# Patient Record
Sex: Female | Born: 1955 | Race: White | Hispanic: No | Marital: Married | Smoking: Former smoker
Health system: Southern US, Community
[De-identification: ages and names within clinical notes are randomized; demographics above are authoritative.]

## PROBLEM LIST (undated history)

## (undated) ENCOUNTER — Emergency Department: Discharge status: Absent without leave | Payer: Medicare Other

## (undated) DIAGNOSIS — I779 Disorder of arteries and arterioles, unspecified: Secondary | ICD-10-CM

## (undated) DIAGNOSIS — E785 Hyperlipidemia, unspecified: Secondary | ICD-10-CM

## (undated) DIAGNOSIS — R55 Syncope and collapse: Secondary | ICD-10-CM

## (undated) DIAGNOSIS — I10 Essential (primary) hypertension: Secondary | ICD-10-CM

## (undated) DIAGNOSIS — I5189 Other ill-defined heart diseases: Secondary | ICD-10-CM

## (undated) DIAGNOSIS — M069 Rheumatoid arthritis, unspecified: Secondary | ICD-10-CM

## (undated) DIAGNOSIS — I471 Supraventricular tachycardia, unspecified: Secondary | ICD-10-CM

## (undated) DIAGNOSIS — E119 Type 2 diabetes mellitus without complications: Secondary | ICD-10-CM

## (undated) DIAGNOSIS — C801 Malignant (primary) neoplasm, unspecified: Secondary | ICD-10-CM

## (undated) HISTORY — DX: Disorder of arteries and arterioles, unspecified: I77.9

## (undated) HISTORY — PX: BREAST EXCISIONAL BIOPSY: SUR124

## (undated) HISTORY — DX: Rheumatoid arthritis, unspecified: M06.9

## (undated) HISTORY — PX: CHOLECYSTECTOMY: SHX55

## (undated) HISTORY — DX: Other ill-defined heart diseases: I51.89

## (undated) HISTORY — DX: Syncope and collapse: R55

## (undated) HISTORY — DX: Supraventricular tachycardia, unspecified: I47.10

---

## 1998-10-16 ENCOUNTER — Emergency Department (HOSPITAL_COMMUNITY): Admission: EM | Admit: 1998-10-16 | Discharge: 1998-10-16 | Payer: Self-pay | Admitting: Emergency Medicine

## 2000-05-24 DIAGNOSIS — M069 Rheumatoid arthritis, unspecified: Secondary | ICD-10-CM | POA: Insufficient documentation

## 2002-03-29 ENCOUNTER — Ambulatory Visit (HOSPITAL_COMMUNITY): Admission: RE | Admit: 2002-03-29 | Discharge: 2002-03-29 | Payer: Self-pay | Admitting: Obstetrics and Gynecology

## 2002-04-04 ENCOUNTER — Encounter (INDEPENDENT_AMBULATORY_CARE_PROVIDER_SITE_OTHER): Payer: Self-pay | Admitting: Specialist

## 2002-04-04 ENCOUNTER — Encounter: Admission: RE | Admit: 2002-04-04 | Discharge: 2002-04-04 | Payer: Self-pay | Admitting: Family Medicine

## 2002-04-04 ENCOUNTER — Encounter: Payer: Self-pay | Admitting: Internal Medicine

## 2002-05-15 ENCOUNTER — Ambulatory Visit (HOSPITAL_BASED_OUTPATIENT_CLINIC_OR_DEPARTMENT_OTHER): Admission: RE | Admit: 2002-05-15 | Discharge: 2002-05-15 | Payer: Self-pay | Admitting: General Surgery

## 2002-05-15 ENCOUNTER — Encounter (INDEPENDENT_AMBULATORY_CARE_PROVIDER_SITE_OTHER): Payer: Self-pay | Admitting: *Deleted

## 2004-01-25 ENCOUNTER — Emergency Department: Payer: Self-pay | Admitting: Emergency Medicine

## 2004-01-27 ENCOUNTER — Emergency Department: Payer: Self-pay | Admitting: Emergency Medicine

## 2004-02-04 ENCOUNTER — Emergency Department: Payer: Self-pay | Admitting: Emergency Medicine

## 2004-11-19 ENCOUNTER — Ambulatory Visit: Payer: Self-pay | Admitting: Family Medicine

## 2004-12-01 ENCOUNTER — Ambulatory Visit: Payer: Self-pay | Admitting: *Deleted

## 2004-12-10 ENCOUNTER — Ambulatory Visit: Payer: Self-pay | Admitting: Family Medicine

## 2004-12-31 ENCOUNTER — Ambulatory Visit: Payer: Self-pay | Admitting: Family Medicine

## 2005-01-07 ENCOUNTER — Encounter: Admission: RE | Admit: 2005-01-07 | Discharge: 2005-01-07 | Payer: Self-pay | Admitting: Family Medicine

## 2005-04-29 ENCOUNTER — Ambulatory Visit: Payer: Self-pay | Admitting: Family Medicine

## 2005-05-04 ENCOUNTER — Ambulatory Visit: Payer: Self-pay | Admitting: Family Medicine

## 2005-07-29 ENCOUNTER — Ambulatory Visit: Payer: Self-pay | Admitting: Family Medicine

## 2006-03-02 ENCOUNTER — Ambulatory Visit: Payer: Self-pay | Admitting: Family Medicine

## 2006-04-07 ENCOUNTER — Ambulatory Visit (HOSPITAL_COMMUNITY): Admission: RE | Admit: 2006-04-07 | Discharge: 2006-04-07 | Payer: Self-pay | Admitting: Internal Medicine

## 2006-05-11 ENCOUNTER — Ambulatory Visit: Payer: Self-pay | Admitting: Family Medicine

## 2006-05-11 ENCOUNTER — Encounter (INDEPENDENT_AMBULATORY_CARE_PROVIDER_SITE_OTHER): Payer: Self-pay | Admitting: Family Medicine

## 2006-05-12 DIAGNOSIS — I10 Essential (primary) hypertension: Secondary | ICD-10-CM | POA: Insufficient documentation

## 2006-05-12 DIAGNOSIS — E782 Mixed hyperlipidemia: Secondary | ICD-10-CM | POA: Insufficient documentation

## 2006-05-12 DIAGNOSIS — F410 Panic disorder [episodic paroxysmal anxiety] without agoraphobia: Secondary | ICD-10-CM | POA: Insufficient documentation

## 2006-06-02 ENCOUNTER — Ambulatory Visit: Payer: Self-pay | Admitting: Family Medicine

## 2006-11-10 ENCOUNTER — Encounter (INDEPENDENT_AMBULATORY_CARE_PROVIDER_SITE_OTHER): Payer: Self-pay | Admitting: *Deleted

## 2007-03-07 ENCOUNTER — Ambulatory Visit: Payer: Self-pay | Admitting: Internal Medicine

## 2007-03-07 LAB — CONVERTED CEMR LAB
ALT: 13 units/L (ref 0–35)
AST: 12 units/L (ref 0–37)
BUN: 18 mg/dL (ref 6–23)
Basophils Absolute: 0.1 10*3/uL (ref 0.0–0.1)
Basophils Relative: 1 % (ref 0–1)
CO2: 21 meq/L (ref 19–32)
Cholesterol: 173 mg/dL (ref 0–200)
Creatinine, Ser: 0.87 mg/dL (ref 0.40–1.20)
Eosinophils Relative: 1 % (ref 0–5)
HCT: 40.9 % (ref 36.0–46.0)
HDL: 40 mg/dL (ref >39)
Hemoglobin: 13.5 g/dL (ref 12.0–15.0)
MCHC: 33 g/dL (ref 30.0–36.0)
Monocytes Absolute: 0.5 10*3/uL (ref 0.1–1.0)
RDW: 14.1 % (ref 11.5–15.5)
Total Bilirubin: 0.3 mg/dL (ref 0.3–1.2)
Total CHOL/HDL Ratio: 4.3
VLDL: 42 mg/dL — ABNORMAL HIGH (ref 0–40)

## 2007-11-29 ENCOUNTER — Ambulatory Visit: Payer: Self-pay | Admitting: Family Medicine

## 2007-11-29 LAB — CONVERTED CEMR LAB
ALT: 18 units/L (ref 0–35)
BUN: 17 mg/dL (ref 6–23)
CO2: 24 meq/L (ref 19–32)
Cholesterol: 165 mg/dL (ref 0–200)
Creatinine, Ser: 0.77 mg/dL (ref 0.40–1.20)
Glucose, Bld: 115 mg/dL — ABNORMAL HIGH (ref 70–99)
HDL: 40 mg/dL (ref >39)
Sed Rate: 19 mm/hr (ref 0–22)
Total Bilirubin: 0.5 mg/dL (ref 0.3–1.2)
Total CHOL/HDL Ratio: 4.1
Triglycerides: 195 mg/dL — ABNORMAL HIGH (ref <150)
VLDL: 39 mg/dL (ref 0–40)

## 2007-12-06 ENCOUNTER — Ambulatory Visit: Payer: Self-pay | Admitting: Internal Medicine

## 2007-12-09 ENCOUNTER — Ambulatory Visit (HOSPITAL_COMMUNITY): Admission: RE | Admit: 2007-12-09 | Discharge: 2007-12-09 | Payer: Self-pay | Admitting: Family Medicine

## 2008-02-02 ENCOUNTER — Encounter (INDEPENDENT_AMBULATORY_CARE_PROVIDER_SITE_OTHER): Payer: Self-pay | Admitting: Adult Health

## 2008-02-02 ENCOUNTER — Ambulatory Visit: Payer: Self-pay | Admitting: Internal Medicine

## 2008-02-02 LAB — CONVERTED CEMR LAB
ALT: 41 units/L — ABNORMAL HIGH (ref 0–35)
Alkaline Phosphatase: 81 units/L (ref 39–117)
Basophils Absolute: 0.1 10*3/uL (ref 0.0–0.1)
Cholesterol: 186 mg/dL (ref 0–200)
Creatinine, Ser: 0.71 mg/dL (ref 0.40–1.20)
Eosinophils Absolute: 0.1 10*3/uL (ref 0.0–0.7)
Eosinophils Relative: 1 % (ref 0–5)
HCT: 43.2 % (ref 36.0–46.0)
Hemoglobin: 14.2 g/dL (ref 12.0–15.0)
LDL Cholesterol: 115 mg/dL — ABNORMAL HIGH (ref 0–99)
MCHC: 32.9 g/dL (ref 30.0–36.0)
MCV: 92.9 fL (ref 78.0–100.0)
Monocytes Absolute: 0.4 10*3/uL (ref 0.1–1.0)
Platelets: 440 10*3/uL — ABNORMAL HIGH (ref 150–400)
RDW: 15.2 % (ref 11.5–15.5)
Sodium: 136 meq/L (ref 135–145)
Total Bilirubin: 0.5 mg/dL (ref 0.3–1.2)
Total CHOL/HDL Ratio: 4.3
Total Protein: 8 g/dL (ref 6.0–8.3)
Triglycerides: 140 mg/dL (ref <150)
VLDL: 28 mg/dL (ref 0–40)

## 2008-02-03 ENCOUNTER — Ambulatory Visit (HOSPITAL_COMMUNITY): Admission: RE | Admit: 2008-02-03 | Discharge: 2008-02-03 | Payer: Self-pay | Admitting: Internal Medicine

## 2008-02-07 ENCOUNTER — Ambulatory Visit (HOSPITAL_COMMUNITY): Admission: RE | Admit: 2008-02-07 | Discharge: 2008-02-07 | Payer: Self-pay | Admitting: Internal Medicine

## 2008-02-23 ENCOUNTER — Ambulatory Visit: Payer: Self-pay | Admitting: Internal Medicine

## 2008-03-13 ENCOUNTER — Ambulatory Visit: Payer: Self-pay | Admitting: Internal Medicine

## 2008-04-04 ENCOUNTER — Encounter (INDEPENDENT_AMBULATORY_CARE_PROVIDER_SITE_OTHER): Payer: Self-pay | Admitting: Internal Medicine

## 2008-04-04 ENCOUNTER — Ambulatory Visit: Payer: Self-pay | Admitting: Internal Medicine

## 2008-04-04 ENCOUNTER — Encounter (INDEPENDENT_AMBULATORY_CARE_PROVIDER_SITE_OTHER): Payer: Self-pay | Admitting: Adult Health

## 2008-04-04 LAB — CONVERTED CEMR LAB
ALT: 40 units/L — ABNORMAL HIGH (ref 0–35)
AST: 28 units/L (ref 0–37)
Albumin: 4.6 g/dL (ref 3.5–5.2)
Alkaline Phosphatase: 69 units/L (ref 39–117)
Basophils Absolute: 0.1 10*3/uL (ref 0.0–0.1)
Basophils Relative: 1 % (ref 0–1)
Eosinophils Absolute: 0.2 10*3/uL (ref 0.0–0.7)
Eosinophils Relative: 3 % (ref 0–5)
HCT: 41.3 % (ref 36.0–46.0)
LDL Cholesterol: 91 mg/dL (ref 0–99)
Lymphs Abs: 2.1 10*3/uL (ref 0.7–4.0)
MCV: 96 fL (ref 78.0–100.0)
Microalb, Ur: 0.95 mg/dL (ref 0.00–1.89)
Neutrophils Relative %: 62 % (ref 43–77)
Platelets: 374 10*3/uL (ref 150–400)
Potassium: 4.1 meq/L (ref 3.5–5.3)
RDW: 14.3 % (ref 11.5–15.5)
Sodium: 137 meq/L (ref 135–145)
Total Bilirubin: 0.5 mg/dL (ref 0.3–1.2)
Total Protein: 7.5 g/dL (ref 6.0–8.3)
Triglycerides: 231 mg/dL — ABNORMAL HIGH (ref <150)
VLDL: 46 mg/dL — ABNORMAL HIGH (ref 0–40)
WBC: 7.6 10*3/uL (ref 4.0–10.5)

## 2008-04-26 ENCOUNTER — Ambulatory Visit: Payer: Self-pay | Admitting: Internal Medicine

## 2009-03-08 ENCOUNTER — Ambulatory Visit: Payer: Self-pay | Admitting: General Surgery

## 2009-09-25 ENCOUNTER — Ambulatory Visit: Payer: Self-pay

## 2009-10-24 ENCOUNTER — Ambulatory Visit: Payer: Self-pay

## 2009-11-23 ENCOUNTER — Ambulatory Visit: Payer: Self-pay

## 2009-12-24 ENCOUNTER — Ambulatory Visit: Payer: Self-pay

## 2010-02-10 IMAGING — CR DG CHEST 2V
2 series · 2 of 2 positions shown · non-contrast
Comparison: None

CLINICAL DATA: History given of tobacco smoking .

CHEST - 2 VIEW

[w chest pa]
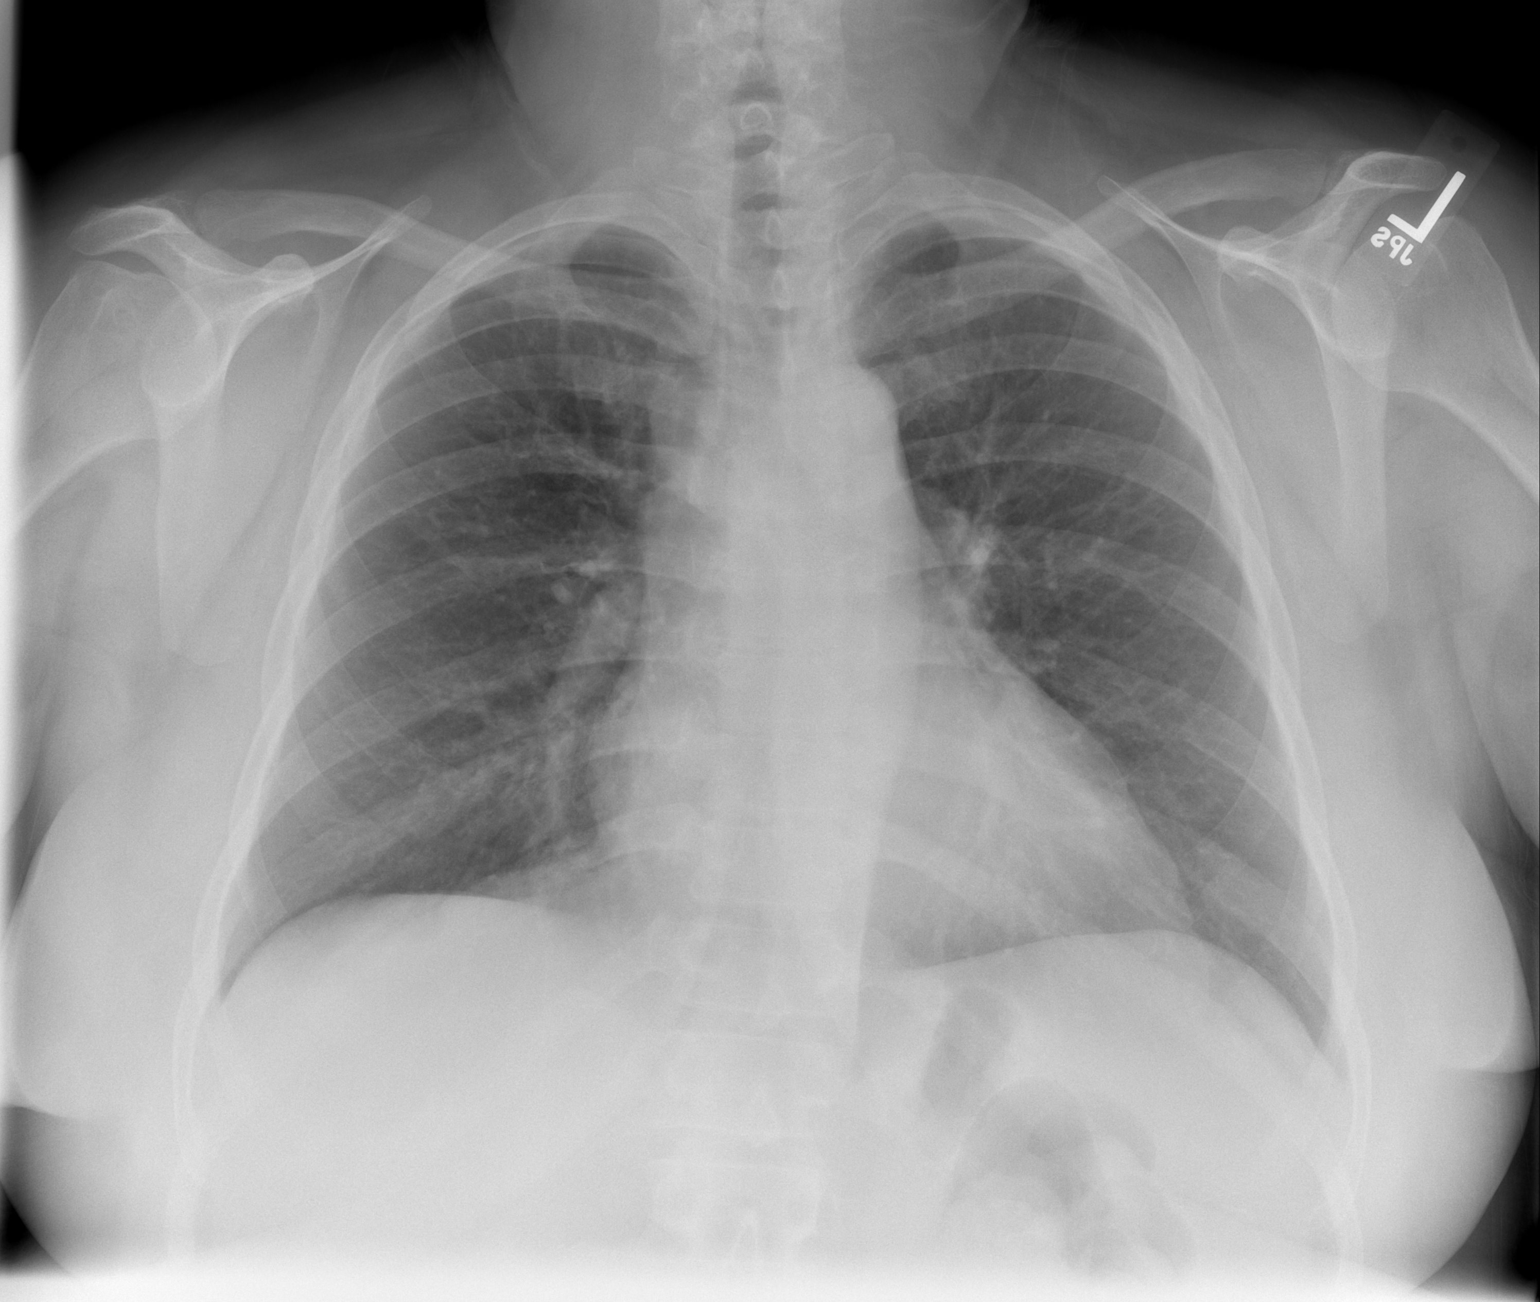

[w chest lat]
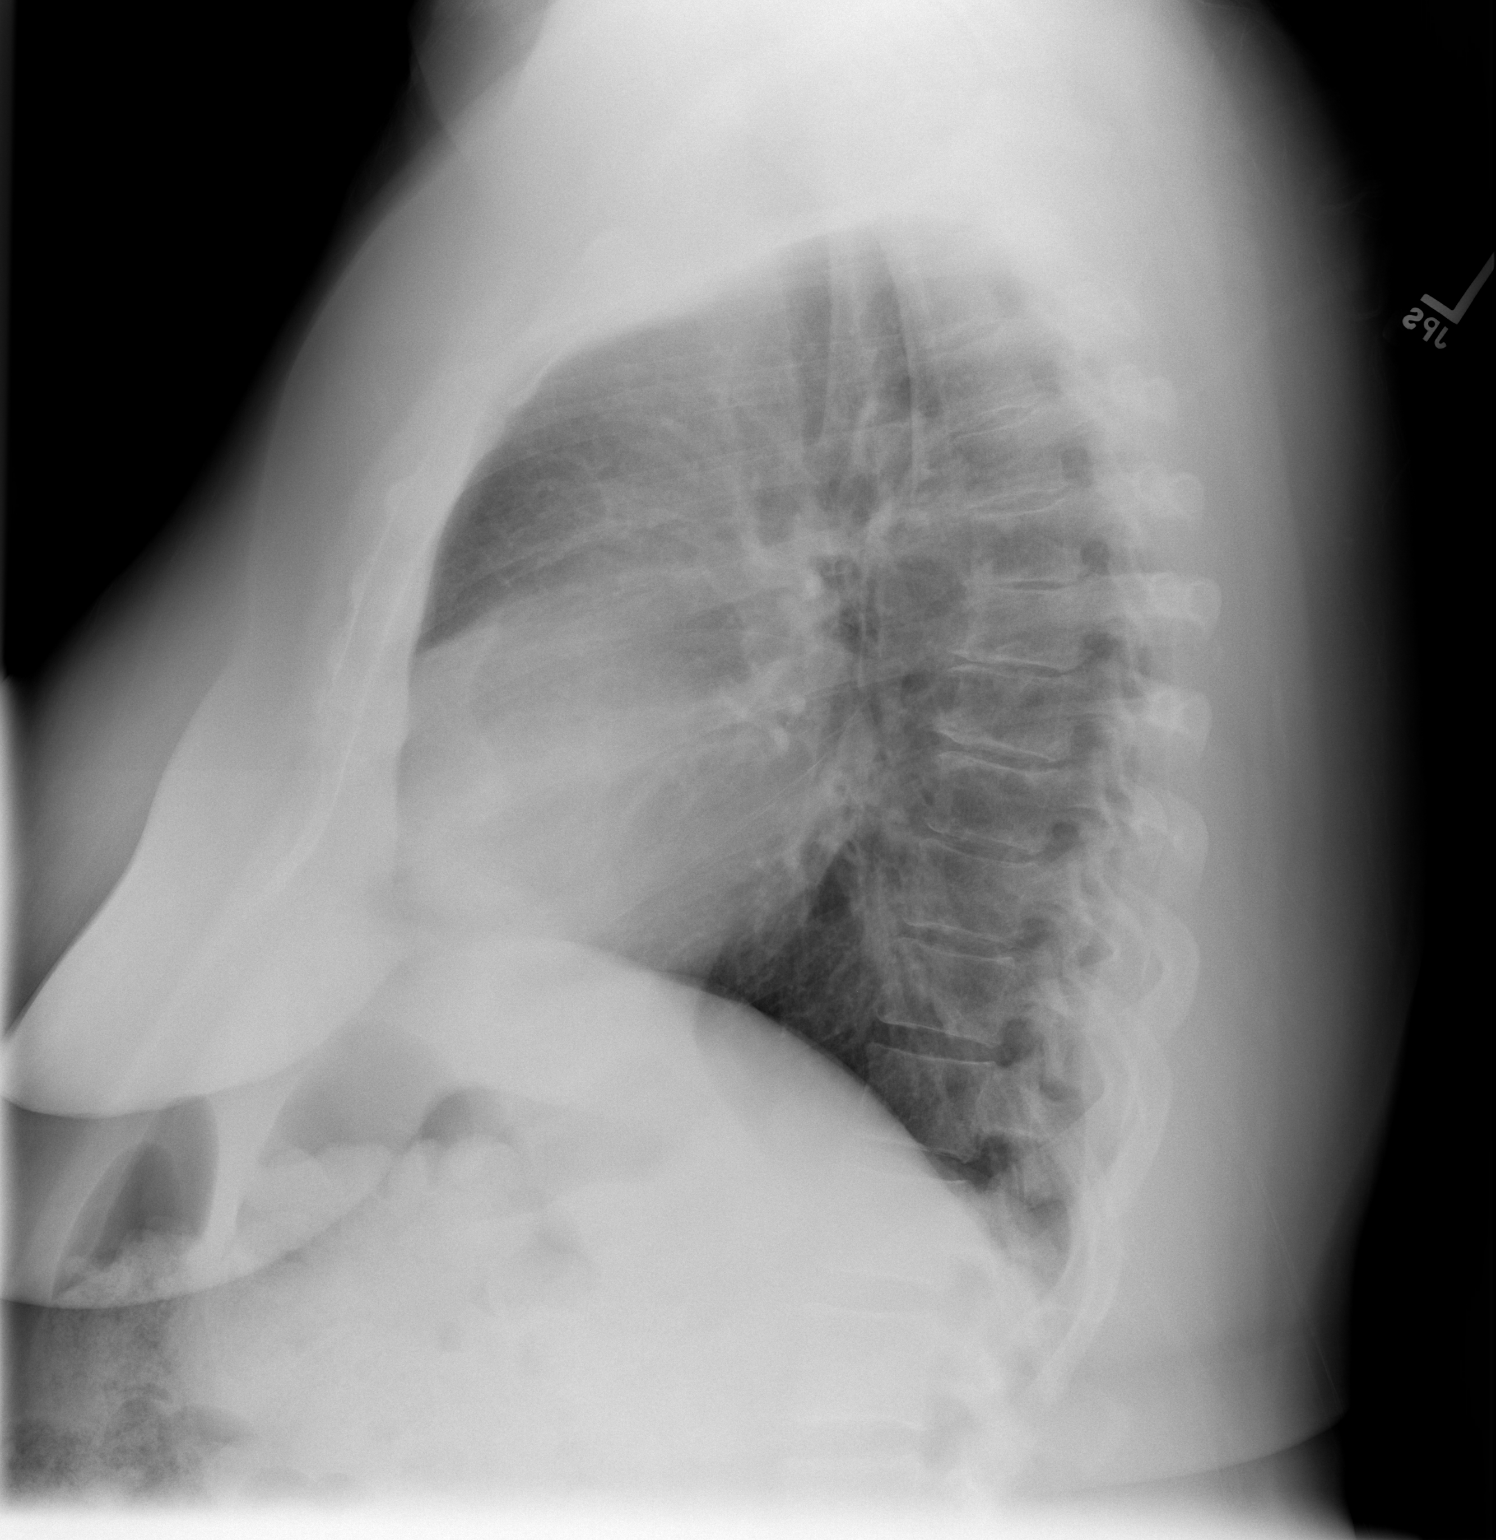

[2 of 2 positions shown; findings below may reference images not displayed]

FINDINGS: Cardiac silhouette is upper range normal in size.  The
lungs are free of infiltrates.  No pulmonary mass or nodule is
evident.  No pleural disease is seen.  There is mild degenerative
disc disease and degenerative spondylosis .
IMPRESSION: No acute or active process is identified.

## 2010-07-11 NOTE — Op Note (Signed)
NAME:  Ellen Taylor, SCHOMBURG                        ACCOUNT NO.:  [id number]   MEDICAL RECORD NO.:  [id number]                   PATIENT TYPE:  AMB   LOCATION:  DSC                                  FACILITY:  [facility]   PHYSICIAN:  Angelia Mould. Derrell Lolling, M.D.             DATE OF BIRTH:  Sep 19, 1955   DATE OF PROCEDURE:  05/15/2002  DATE OF DISCHARGE:                                 OPERATIVE REPORT   PREOPERATIVE DIAGNOSIS:  Left breast mass.   POSTOPERATIVE DIAGNOSIS:  Left breast mass.   OPERATION PERFORMED:  Excisional biopsy, left breast mass.   SURGEON:  Angelia Mould. Derrell Lolling, M.D.   ANESTHESIA:  General LMA.   INDICATIONS FOR PROCEDURE:  The patient is a 55 year old white female who  has felt a lump in her left breast medially for about two months and she  states that this is growing rapidly.  No discomfort.  Exam reveals a  palpable mass at the 9 o'clock position of the left breast.  This was  mobile, firm and slightly irregular.  No other dominant mass, no skin change  noted.  Mammograms and ultrasound done recently showed a 2.0 cm mass in the  left breast at the 9 o'clock position which was solid, well circumscribed  and had echogenic margins.  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  This was discussed with  the patient.  She was agreeable and brought to the operating room  electively.   DESCRIPTION OF PROCEDURE:  Following the induction of a general LMA  anesthetic, the patient's left breast was prepped and draped in a sterile  fashion.  The left breast mass was again palpated at about the 8:30 position  and was marked.  This was ideal location for a transverse incision.  A  transverse incision was made.  Skin retractors were used and we dissected  circumferentially around the tumor quite easily got a good margin in all  directions.  We marked the  tumor with the radiopaque metallic margin markers  and removed the tumor and sent it fresh to pathology.  Hemostasis was  excellent and achieved with electrocautery.  We infiltrated all of the  breast tissues in the area with about 20 ml of 0.5% Marcaine with  epinephrine.  The wound was irrigated again.  Hemostasis was  excellent.  The skin was closed with running subcuticular suture of 4-0  Vicryl and Steri-Strips.  Clean bandages were placed.  The patient was then  transferred to the recovery room in stable condition.  The estimated blood  loss was about 10 cc.  Complications were none.  Sponge, needle and  instrument counts were correct.  Angelia Mould. Derrell Lolling, M.D.    HMI/MEDQ  D:  05/15/2002  T:  05/15/2002  Job:  [id number]   cc:   Willis Modena, NP   Daryl Eastern, M.D.  [address], [address]  [place]  [place]  [address]  Fax: [phone]

## 2010-07-16 ENCOUNTER — Ambulatory Visit: Payer: Self-pay

## 2011-03-18 ENCOUNTER — Ambulatory Visit: Payer: Self-pay | Admitting: Rheumatology

## 2011-04-28 ENCOUNTER — Encounter: Payer: Self-pay | Admitting: Rheumatology

## 2011-05-25 ENCOUNTER — Encounter: Payer: Self-pay | Admitting: Rheumatology

## 2011-09-18 ENCOUNTER — Ambulatory Visit: Payer: Self-pay | Admitting: Rheumatology

## 2013-08-29 ENCOUNTER — Emergency Department (HOSPITAL_COMMUNITY): Payer: Medicare Other

## 2013-08-29 ENCOUNTER — Encounter (HOSPITAL_COMMUNITY): Payer: Self-pay | Admitting: Emergency Medicine

## 2013-08-29 ENCOUNTER — Emergency Department (HOSPITAL_COMMUNITY)
Admission: EM | Admit: 2013-08-29 | Discharge: 2013-08-29 | Disposition: A | Discharge status: Home / Self Care | Payer: Medicare Other | Attending: Emergency Medicine | Admitting: Emergency Medicine

## 2013-08-29 DIAGNOSIS — R42 Dizziness and giddiness: Secondary | ICD-10-CM | POA: Diagnosis not present

## 2013-08-29 DIAGNOSIS — H538 Other visual disturbances: Secondary | ICD-10-CM

## 2013-08-29 DIAGNOSIS — I10 Essential (primary) hypertension: Secondary | ICD-10-CM | POA: Diagnosis not present

## 2013-08-29 DIAGNOSIS — Z85528 Personal history of other malignant neoplasm of kidney: Secondary | ICD-10-CM | POA: Insufficient documentation

## 2013-08-29 DIAGNOSIS — Z87891 Personal history of nicotine dependence: Secondary | ICD-10-CM | POA: Insufficient documentation

## 2013-08-29 DIAGNOSIS — E119 Type 2 diabetes mellitus without complications: Secondary | ICD-10-CM | POA: Diagnosis not present

## 2013-08-29 DIAGNOSIS — R209 Unspecified disturbances of skin sensation: Secondary | ICD-10-CM | POA: Insufficient documentation

## 2013-08-29 DIAGNOSIS — R61 Generalized hyperhidrosis: Secondary | ICD-10-CM | POA: Diagnosis not present

## 2013-08-29 DIAGNOSIS — R202 Paresthesia of skin: Secondary | ICD-10-CM

## 2013-08-29 HISTORY — DX: Malignant (primary) neoplasm, unspecified: C80.1

## 2013-08-29 HISTORY — DX: Essential (primary) hypertension: I10

## 2013-08-29 HISTORY — DX: Type 2 diabetes mellitus without complications: E11.9

## 2013-08-29 HISTORY — DX: Hyperlipidemia, unspecified: E78.5

## 2013-08-29 LAB — I-STAT TROPONIN, ED: Troponin i, poc: 0 ng/mL (ref 0.00–0.08)

## 2013-08-29 LAB — COMPREHENSIVE METABOLIC PANEL
ALK PHOS: 84 U/L (ref 39–117)
ALT: 25 U/L (ref 0–35)
AST: 22 U/L (ref 0–37)
Albumin: 4.5 g/dL (ref 3.5–5.2)
Anion gap: 14 (ref 5–15)
BILIRUBIN TOTAL: 0.4 mg/dL (ref 0.3–1.2)
BUN: 14 mg/dL (ref 6–23)
CALCIUM: 10 mg/dL (ref 8.4–10.5)
CHLORIDE: 100 meq/L (ref 96–112)
CO2: 28 meq/L (ref 19–32)
Creatinine, Ser: 0.86 mg/dL (ref 0.50–1.10)
GFR, EST AFRICAN AMERICAN: 87 mL/min — AB (ref >90)
GFR, EST NON AFRICAN AMERICAN: 75 mL/min — AB (ref >90)
GLUCOSE: 115 mg/dL — AB (ref 70–99)
Potassium: 4.2 mEq/L (ref 3.7–5.3)
SODIUM: 142 meq/L (ref 137–147)
Total Protein: 7.9 g/dL (ref 6.0–8.3)

## 2013-08-29 LAB — CBC
HEMATOCRIT: 40.9 % (ref 36.0–46.0)
Hemoglobin: 13.4 g/dL (ref 12.0–15.0)
MCH: 28.8 pg (ref 26.0–34.0)
MCHC: 32.8 g/dL (ref 30.0–36.0)
MCV: 87.8 fL (ref 78.0–100.0)
PLATELETS: 367 10*3/uL (ref 150–400)
RBC: 4.66 MIL/uL (ref 3.87–5.11)
RDW: 13.8 % (ref 11.5–15.5)
WBC: 7.4 10*3/uL (ref 4.0–10.5)

## 2013-08-29 LAB — URINALYSIS, ROUTINE W REFLEX MICROSCOPIC
Bilirubin Urine: NEGATIVE
Glucose, UA: NEGATIVE mg/dL
Hgb urine dipstick: NEGATIVE
KETONES UR: NEGATIVE mg/dL
Nitrite: NEGATIVE
PROTEIN: NEGATIVE mg/dL
Specific Gravity, Urine: 1.016 (ref 1.005–1.030)
UROBILINOGEN UA: 1 mg/dL (ref 0.0–1.0)
pH: 7 (ref 5.0–8.0)

## 2013-08-29 LAB — RAPID URINE DRUG SCREEN, HOSP PERFORMED
AMPHETAMINES: NOT DETECTED
BARBITURATES: NOT DETECTED
BENZODIAZEPINES: NOT DETECTED
Cocaine: NOT DETECTED
Opiates: NOT DETECTED
TETRAHYDROCANNABINOL: NOT DETECTED

## 2013-08-29 LAB — ETHANOL: Alcohol, Ethyl (B): <11 mg/dL (ref 0–11)

## 2013-08-29 LAB — DIFFERENTIAL
Basophils Absolute: 0.1 10*3/uL (ref 0.0–0.1)
Basophils Relative: 1 % (ref 0–1)
EOS PCT: 3 % (ref 0–5)
Eosinophils Absolute: 0.2 10*3/uL (ref 0.0–0.7)
LYMPHS ABS: 2.2 10*3/uL (ref 0.7–4.0)
LYMPHS PCT: 29 % (ref 12–46)
Monocytes Absolute: 0.5 10*3/uL (ref 0.1–1.0)
Monocytes Relative: 6 % (ref 3–12)
NEUTROS ABS: 4.5 10*3/uL (ref 1.7–7.7)
Neutrophils Relative %: 61 % (ref 43–77)

## 2013-08-29 LAB — PROTIME-INR
INR: 1.08 (ref 0.00–1.49)
PROTHROMBIN TIME: 14 s (ref 11.6–15.2)

## 2013-08-29 LAB — URINE MICROSCOPIC-ADD ON

## 2013-08-29 LAB — I-STAT CHEM 8, ED
BUN: 15 mg/dL (ref 6–23)
CALCIUM ION: 1.21 mmol/L (ref 1.12–1.23)
CHLORIDE: 100 meq/L (ref 96–112)
Creatinine, Ser: 0.9 mg/dL (ref 0.50–1.10)
GLUCOSE: 114 mg/dL — AB (ref 70–99)
HCT: 44 % (ref 36.0–46.0)
Hemoglobin: 15 g/dL (ref 12.0–15.0)
Potassium: 3.9 mEq/L (ref 3.7–5.3)
Sodium: 140 mEq/L (ref 137–147)
TCO2: 28 mmol/L (ref 0–100)

## 2013-08-29 LAB — APTT: APTT: 34 s (ref 24–37)

## 2013-08-29 LAB — CBG MONITORING, ED: GLUCOSE-CAPILLARY: 156 mg/dL — AB (ref 70–99)

## 2013-08-29 MED ORDER — IOHEXOL 350 MG/ML SOLN
50.0000 mL | Freq: Once | INTRAVENOUS | Status: AC | PRN
  Administered 2013-08-29: 50 mL via INTRAVENOUS

## 2013-08-29 NOTE — ED Notes (Signed)
Purse with Husband Coralyn Mark and son Einar Pheasant at bedside.

## 2013-08-29 NOTE — ED Notes (Signed)
MD Reynolds at bedside. 

## 2013-08-29 NOTE — ED Notes (Signed)
Pt transported to CT for CTA head/neck with primary RN and Stroke Team RN

## 2013-08-29 NOTE — ED Notes (Signed)
Per [facility]: Pt states while driving today at 0354 she had sudden onset of dizziness and diaphoresis. States she then started to develop left sided numbness/tingling to face, neck, arm and leg. Pt also reports new blurred vision to left eye. Denies slurred speech, weakness, or confusion. Pt AO x4. 146/96. CBG 167.

## 2013-08-29 NOTE — Code Documentation (Signed)
58yo female arriving to [facility] via [place].  Patient reports that she has had intermittent right arm numbness and tingling for the past 3 days.  Episodes also including dizziness, but resolve in minutes.  Today at 1230 patient became very sweaty and dizzy.  Then she developed left sided numbness and tingling in her face and arm at 1530. She was in the car with her mother and stopped at the pharmacy.  EMS called.  Code stroke called on arrival to the hospital.  Patient with h/o DM and recently adjusted medications.  Patient taken to CT.  Initial NIHSS 1 for decreased sensation in the left arm.  Patient reports blurred vision in the left eye, however, all visual fields are intact.  She reports left leg "heaviness" and numbness to her left mouth and face and left arm and leg.  Dr. Doy Mince to bedside.  Patient taken for CTA with Stroke and RRT RNs per MD.  Patient back to room.  Dizziness has improved per patient, but continues to report numbness.  LKW 1230, patient outside the window for treatment with tPA.  Dr. Doy Mince made aware that CTA is complete. Bedside handoff with ED RN [place].

## 2013-08-29 NOTE — ED Notes (Signed)
Dr. Jacubowitz at bedside 

## 2013-08-29 NOTE — Consult Note (Signed)
Referring Physician: Winfred Leeds    Chief Complaint: Left sided numbness, blurred vision  HPI: Ellen Taylor is an 58 y.o. female who reports that prior to today she has been having intermittent episodes of RUE numbness. Today while out driving had acute onset of dizziness and diaphoresis.  Patient then started to develop left sided numbness and blurred vision.  Decided that she may need to be seen and came to the ED.  Code stroke was called at that time.  Initial NIHSS of 1.    Date last known well: Date: 08/29/2013 Time last known well: Time: 12:30 tPA Given: No: Low initial NIHSS  Past Medical History  Diagnosis Date  . Diabetes mellitus without complication   . Hypertension   . Hyperlipemia   . Cancer     kidney cancer, 2012. Partial removal of left kidney     Past Surgical History  Procedure Laterality Date  . Cholecystectomy      History reviewed. No pertinent family history.  Social History:  reports that she has been smoking Cigarettes.  She has a 17.5 pack-year smoking history. She does not have any smokeless tobacco history on file. She reports that she does not use illicit drugs. Her alcohol history is not on file.  Allergies:  Allergies  Allergen Reactions  . Metformin And Related Nausea Only    Medications:  Current outpatient prescriptions: ALPRAZolam (XANAX) 1 MG tablet, Take 1 mg by mouth at bedtime as needed for anxiety., Disp: , Rfl: ;   B Complex Vitamins (VITAMIN-B COMPLEX PO), Take 1 tablet by mouth daily., Disp: , Rfl: ;   buPROPion (WELLBUTRIN XL) 150 MG 24 hr tablet, Take 150 mg by mouth daily., Disp: , Rfl: ;  Cholecalciferol (VITAMIN D) 2000 UNITS CAPS, Take 1 capsule by mouth daily., Disp: , Rfl:  cyclobenzaprine (FLEXERIL) 5 MG tablet, Take 5 mg by mouth 3 (three) times daily as needed for muscle spasms., Disp: , Rfl: ;   folic acid (FOLVITE) 1 MG tablet, Take 1 mg by mouth daily., Disp: , Rfl: ;   glimepiride (AMARYL) 4 MG tablet, Take 4 mg by  mouth daily with breakfast., Disp: , Rfl: ;   HYDROcodone-acetaminophen (NORCO) 10-325 MG per tablet, Take 1 tablet by mouth every 6 (six) hours as needed for moderate pain. , Disp: , Rfl:  Linaclotide (LINZESS) 145 MCG CAPS capsule, Take 145 mcg by mouth as needed (constipation). , Disp: , Rfl: ;   lisinopril (PRINIVIL,ZESTRIL) 10 MG tablet, Take 10 mg by mouth daily., Disp: , Rfl: ;   meloxicam (MOBIC) 7.5 MG tablet, Take 7.5 mg by mouth daily., Disp: , Rfl: ;   methotrexate (RHEUMATREX) 2.5 MG tablet, Take 20 mg by mouth once a week. Caution:Chemotherapy. Protect from light., Disp: , Rfl:  pravastatin (PRAVACHOL) 10 MG tablet, Take 10 mg by mouth daily., Disp: , Rfl:   ROS: History obtained from the patient  General ROS: negative for - chills, fatigue, fever, night sweats, weight gain or weight loss Psychological ROS: negative for - behavioral disorder, hallucinations, memory difficulties, mood swings or suicidal ideation Ophthalmic ROS: negative for - blurry vision, double vision, eye pain or loss of vision ENT ROS: negative for - epistaxis, nasal discharge, oral lesions, sore throat, tinnitus or vertigo Allergy and Immunology ROS: negative for - hives or itchy/watery eyes Hematological and Lymphatic ROS: negative for - bleeding problems, bruising or swollen lymph nodes Endocrine ROS: negative for - galactorrhea, hair pattern changes, polydipsia/polyuria or temperature intolerance Respiratory ROS: negative  for - cough, hemoptysis, shortness of breath or wheezing Cardiovascular ROS: negative for - chest pain, dyspnea on exertion, edema or irregular heartbeat Gastrointestinal ROS: negative for - abdominal pain, diarrhea, hematemesis, nausea/vomiting or stool incontinence Genito-Urinary ROS: negative for - dysuria, hematuria, incontinence or urinary frequency/urgency Musculoskeletal ROS: neck pain Neurological ROS: as noted in HPI Dermatological ROS: negative for rash and skin lesion  changes  Physical Examination: Blood pressure 145/65, pulse 61, temperature 97.9 F (36.6 C), resp. rate 11, height 5\' 2"  (1.575 m), weight 73.936 kg (163 lb), SpO2 98.00%.  Neurologic Examination: Mental Status: Alert, oriented, thought content appropriate.  Speech fluent without evidence of aphasia.  Able to follow 3 step commands without difficulty. Cranial Nerves: II: Discs flat bilaterally; Vision blurred from both eyes but no specific field cut.  Pupils equal, round, reactive to light and accommodation III,IV, VI: ptosis not present, extra-ocular motions intact bilaterally V,VII: smile symmetric, facial light touch sensation decreased on the left VIII: hearing normal bilaterally IX,X: gag reflex present XI: bilateral shoulder shrug XII: midline tongue extension Motor: Right : Upper extremity   5/5    Left:     Upper extremity   5/5  Lower extremity   5/5     Lower extremity   5/5 Tone and bulk:normal tone throughout; no atrophy noted Sensory: Pinprick and light touch decreased in the LUE Deep Tendon Reflexes: 2+ and symmetric with absent AJ's bilaterally Plantars: Right: mute   Left: mute Cerebellar: normal finger-to-nose and normal heel-to-shin test Gait: Unable to test CV: pulses palpable throughout     Laboratory Studies:  Basic Metabolic Panel:  Recent Labs Lab 08/29/13 1640 08/29/13 1648  NA 142 140  K 4.2 3.9  CL 100 100  CO2 28  --   GLUCOSE 115* 114*  BUN 14 15  CREATININE 0.86 0.90  CALCIUM 10.0  --     Liver Function Tests:  Recent Labs Lab 08/29/13 1640  AST 22  ALT 25  ALKPHOS 84  BILITOT 0.4  PROT 7.9  ALBUMIN 4.5   No results found for this basename: LIPASE, AMYLASE,  in the last 168 hours No results found for this basename: AMMONIA,  in the last 168 hours  CBC:  Recent Labs Lab 08/29/13 1640 08/29/13 1648  WBC 7.4  --   NEUTROABS 4.5  --   HGB 13.4 15.0  HCT 40.9 44.0  MCV 87.8  --   PLT 367  --     Cardiac  Enzymes: No results found for this basename: CKTOTAL, CKMB, CKMBINDEX, TROPONINI,  in the last 168 hours  BNP: No components found with this basename: POCBNP,   CBG:  Recent Labs Lab 08/29/13 1631  GLUCAP 156*    Microbiology: No results found for this or any previous visit.  Coagulation Studies:  Recent Labs  08/29/13 1640  LABPROT 14.0  INR 1.08    Urinalysis: No results found for this basename: COLORURINE, APPERANCEUR, LABSPEC, PHURINE, GLUCOSEU, HGBUR, BILIRUBINUR, KETONESUR, PROTEINUR, UROBILINOGEN, NITRITE, LEUKOCYTESUR,  in the last 168 hours  Lipid Panel:    Component Value Date/Time   CHOL 177 04/04/2008 2032   TRIG 231* 04/04/2008 2032   HDL 40 04/04/2008 2032   CHOLHDL 4.4 Ratio 04/04/2008 2032   VLDL 46* 04/04/2008 2032   Traver 91 04/04/2008 2032    HgbA1C:  No results found for this basename: HGBA1C    Urine Drug Screen:   No results found for this basename: labopia, cocainscrnur, labbenz, amphetmu, thcu, labbarb  Alcohol Level:  Recent Labs Lab 08/29/13 1640  ETH <11    Imaging: Ct Head Wo Contrast  08/29/2013   CLINICAL DATA:  Left-sided weakness  EXAM: CT HEAD WITHOUT CONTRAST  TECHNIQUE: Contiguous axial images were obtained from the base of the skull through the vertex without intravenous contrast.  COMPARISON:  None.  FINDINGS: There is no evidence of mass effect, midline shift or extra-axial fluid collections. There is no evidence of a space-occupying lesion or intracranial hemorrhage. There is no evidence of a cortical-based area of acute infarction.  The ventricles and sulci are appropriate for the patient's age. The basal cisterns are patent.  Visualized portions of the orbits are unremarkable. The visualized portions of the paranasal sinuses and mastoid air cells are unremarkable.  The osseous structures are unremarkable.  IMPRESSION: No acute intracranial pathology. These results were called by telephone at the time of interpretation on  08/29/2013 at 4:44 PM to Dr. Alexis Goodell, Who verbally acknowledged these results.   Electronically Signed   By: Kathreen Devoid   On: 08/29/2013 16:46    Assessment: 58 y.o. female presenting with complaints of dizziness, blurry vision and left sided numbness.  Head CT reviewed and shows no acute changes.  Concern remains for posterior circulation compromise.  Further work up recommended.    Stroke Risk Factors - diabetes mellitus, hyperlipidemia and hypertension  Plan: 1. MRI, MRA  of the brain without contrast 2. If MRI unremarkable would not initiate stroke work up. 3. Neuro checks 4. Telemetry 5. ASA 325mg  daily 6. CTA of head and neck   Alexis Goodell, MD [facility] [phone] 08/29/2013, 5:44 PM  Addendum: CTA of head and neck reviewed with radiology.  No evidence of vascular occlusion noted.    Alexis Goodell, MD [facility] [phone]

## 2013-08-29 NOTE — ED Notes (Signed)
Patient transported to MRI 

## 2013-08-29 NOTE — ED Provider Notes (Signed)
CSN: [id number]     Arrival date & time 08/29/13  1621 History   First MD Initiated Contact with Patient 08/29/13 1628     No chief complaint on file.  seen on arrival level V caveat code stroke Chief complaint numbness and blurred vision  (Consider location/radiation/quality/duration/timing/severity/associated sxs/prior Treatment) HPI Developed sudden onset blurred vision in her left thigh and numbness in her left arm 12:30 PM today. He denies difficulty speaking denies headache no other associated symptoms. No treatment prior to coming here EMS performed CBG which was 169. No past medical history on file. No past surgical history on file. past medical history diabetes hypercholesterolemia No family history on file. History  Substance Use Topics  . Smoking status: Not on file  . Smokeless tobacco: Not on file  . Alcohol Use: Not on file   social history ex-smoker no alcohol no illicit drugs OB History   No data available     Review of Systems  Unable to perform ROS: Acuity of condition  Eyes: Positive for visual disturbance.  Neurological: Positive for numbness.      Allergies  Review of patient's allergies indicates not on file.  Home Medications   Prior to Admission medications   Not on File   There were no vitals taken for this visit. Physical Exam  Nursing note and vitals reviewed. Constitutional: She is oriented to person, place, and time. She appears well-developed and well-nourished.  Anxious appearing  HENT:  Head: Normocephalic and atraumatic.  No facial asymmetry  Eyes: Conjunctivae are normal. Pupils are equal, round, and reactive to light.  Neck: Neck supple. No tracheal deviation present. No thyromegaly present.  Cardiovascular: Normal rate and regular rhythm.   No murmur heard. Pulmonary/Chest: Effort normal and breath sounds normal.  Abdominal: Soft. Bowel sounds are normal. She exhibits no distension. There is no tenderness.  Musculoskeletal:  Normal range of motion. She exhibits no edema and no tenderness.  Neurological: She is alert and oriented to person, place, and time. She has normal reflexes. She displays normal reflexes. No cranial nerve deficit. She exhibits normal muscle tone. Coordination normal.  Sensation diminished to light touch at left arm and left leg motor strength 5 over 5 overall. DTRs symmetric bilaterally knee jerk and biceps was ordered bilaterally  Skin: Skin is warm and dry. No rash noted.  Psychiatric: She has a normal mood and affect.    ED Course  Procedures (including critical care time) Labs Review Labs Reviewed  ETHANOL  PROTIME-INR  APTT  CBC  DIFFERENTIAL  COMPREHENSIVE METABOLIC PANEL  URINE RAPID DRUG SCREEN (HOSP PERFORMED)  URINALYSIS, ROUTINE W REFLEX MICROSCOPIC  I-STAT CHEM 8, ED  I-STAT TROPOININ, ED    Imaging Review No results found.   EKG Interpretation   Date/Time:  Tuesday August 29 2013 16:29:13 EDT Ventricular Rate:  79 PR Interval:    QRS Duration: 88 QT Interval:  376 QTC Calculation: 431 R Axis:   93 Text Interpretation:  Age not entered, assumed to be  58 years old for  purpose of ECG interpretation Atrial fibrillation Borderline right axis  deviation Low voltage, precordial leads Anteroseptal infarct, old Baseline  wander in lead(s) I III No significant change since last tracing Confirmed  by Winfred Leeds  MD, Sid Greener [phone]) on 08/29/2013 4:50:42 PM      Results for orders placed during the hospital encounter of 08/29/13  ETHANOL      Result Value Ref Range   Alcohol, Ethyl (B) <11  0 -  11 mg/dL  PROTIME-INR      Result Value Ref Range   Prothrombin Time 14.0  11.6 - 15.2 seconds   INR 1.08  0.00 - 1.49  APTT      Result Value Ref Range   aPTT 34  24 - 37 seconds  CBC      Result Value Ref Range   WBC 7.4  4.0 - 10.5 K/uL   RBC 4.66  3.87 - 5.11 MIL/uL   Hemoglobin 13.4  12.0 - 15.0 g/dL   HCT 40.9  36.0 - 46.0 %   MCV 87.8  78.0 - 100.0 fL   MCH 28.8   26.0 - 34.0 pg   MCHC 32.8  30.0 - 36.0 g/dL   RDW 13.8  11.5 - 15.5 %   Platelets 367  150 - 400 K/uL  DIFFERENTIAL      Result Value Ref Range   Neutrophils Relative % 61  43 - 77 %   Neutro Abs 4.5  1.7 - 7.7 K/uL   Lymphocytes Relative 29  12 - 46 %   Lymphs Abs 2.2  0.7 - 4.0 K/uL   Monocytes Relative 6  3 - 12 %   Monocytes Absolute 0.5  0.1 - 1.0 K/uL   Eosinophils Relative 3  0 - 5 %   Eosinophils Absolute 0.2  0.0 - 0.7 K/uL   Basophils Relative 1  0 - 1 %   Basophils Absolute 0.1  0.0 - 0.1 K/uL  COMPREHENSIVE METABOLIC PANEL      Result Value Ref Range   Sodium 142  137 - 147 mEq/L   Potassium 4.2  3.7 - 5.3 mEq/L   Chloride 100  96 - 112 mEq/L   CO2 28  19 - 32 mEq/L   Glucose, Bld 115 (*) 70 - 99 mg/dL   BUN 14  6 - 23 mg/dL   Creatinine, Ser 0.86  0.50 - 1.10 mg/dL   Calcium 10.0  8.4 - 10.5 mg/dL   Total Protein 7.9  6.0 - 8.3 g/dL   Albumin 4.5  3.5 - 5.2 g/dL   AST 22  0 - 37 U/L   ALT 25  0 - 35 U/L   Alkaline Phosphatase 84  39 - 117 U/L   Total Bilirubin 0.4  0.3 - 1.2 mg/dL   GFR calc non Af Amer 75 (*) >90 mL/min   GFR calc Af Amer 87 (*) >90 mL/min   Anion gap 14  5 - 15  URINE RAPID DRUG SCREEN (HOSP PERFORMED)      Result Value Ref Range   Opiates NONE DETECTED  NONE DETECTED   Cocaine NONE DETECTED  NONE DETECTED   Benzodiazepines NONE DETECTED  NONE DETECTED   Amphetamines NONE DETECTED  NONE DETECTED   Tetrahydrocannabinol NONE DETECTED  NONE DETECTED   Barbiturates NONE DETECTED  NONE DETECTED  URINALYSIS, ROUTINE W REFLEX MICROSCOPIC      Result Value Ref Range   Color, Urine YELLOW  YELLOW   APPearance CLEAR  CLEAR   Specific Gravity, Urine 1.016  1.005 - 1.030   pH 7.0  5.0 - 8.0   Glucose, UA NEGATIVE  NEGATIVE mg/dL   Hgb urine dipstick NEGATIVE  NEGATIVE   Bilirubin Urine NEGATIVE  NEGATIVE   Ketones, ur NEGATIVE  NEGATIVE mg/dL   Protein, ur NEGATIVE  NEGATIVE mg/dL   Urobilinogen, UA 1.0  0.0 - 1.0 mg/dL   Nitrite NEGATIVE   NEGATIVE   Leukocytes, UA  SMALL (*) NEGATIVE  URINE MICROSCOPIC-ADD ON      Result Value Ref Range   Squamous Epithelial / LPF FEW (*) RARE   WBC, UA 0-2  <3 WBC/hpf   Bacteria, UA FEW (*) RARE  I-STAT CHEM 8, ED      Result Value Ref Range   Sodium 140  137 - 147 mEq/L   Potassium 3.9  3.7 - 5.3 mEq/L   Chloride 100  96 - 112 mEq/L   BUN 15  6 - 23 mg/dL   Creatinine, Ser 0.90  0.50 - 1.10 mg/dL   Glucose, Bld 114 (*) 70 - 99 mg/dL   Calcium, Ion 1.21  1.12 - 1.23 mmol/L   TCO2 28  0 - 100 mmol/L   Hemoglobin 15.0  12.0 - 15.0 g/dL   HCT 44.0  36.0 - 46.0 %  I-STAT TROPOININ, ED      Result Value Ref Range   Troponin i, poc 0.00  0.00 - 0.08 ng/mL   Comment 3           CBG MONITORING, ED      Result Value Ref Range   Glucose-Capillary 156 (*) 70 - 99 mg/dL   Ct Angio Head W/cm &/or Wo Cm  08/29/2013   CLINICAL DATA:  Sudden onset of dizziness and diaphoresis. Blurred vision. Left-sided facial numbness and tingling.  EXAM: CT ANGIOGRAPHY HEAD AND NECK  TECHNIQUE: Multidetector CT imaging of the head and neck was performed using the standard protocol during bolus administration of intravenous contrast. Multiplanar CT image reconstructions and MIPs were obtained to evaluate the vascular anatomy. Carotid stenosis measurements (when applicable) are obtained utilizing NASCET criteria, using the distal internal carotid diameter as the denominator.  CONTRAST:  59mL OMNIPAQUE IOHEXOL 350 MG/ML SOLN  COMPARISON:  CT head 08/29/2013 at 1639 hr.  FINDINGS: CTA HEAD FINDINGS  Noncontrast study was performed earlier and was within normal limits. Post infusion, no abnormal enhancement of the brain or meninges.  Right carotid: Skull base, cavernous, and supra clinoid segments demonstrate minor calcific atheromatous change. No carotid stenosis is evident. No ACA or MCA lesion on the right.  Left carotid: Skull base, cavernous, and supra clinoid segments demonstrate minor calcific atheromatous change. No  carotid stenosis is evident. There is no ACA or MCA lesion.  Both posterior cerebral arteries originate from the carotid. There is a rudimentary connection to a hypoplastic, but otherwise unremarkable, basilar artery. No intracranial stenosis of the right or left distal vertebrals. No evidence for basilar dissection or thrombosis.  Review of the MIP images confirms the above findings.  CTA NECK FINDINGS  Lung apices: Scattered air cysts without pulmonary nodule or pneumothorax.  Neck soft tissues: Enlarged thyroid with multiple calcified and noncalcified nodules, likely goiter. No tracheal deviation.  Proximal vasculature: Moderate atheromatous change transverse arch. Conventional branching of the great vessels without proximal lesion  Right carotid: Mild non stenotic atheromatous change at the carotid bifurcation. No dissection or fibromuscular dysplasia.  Left carotid: Moderate calcific change at the bifurcation without ulceration or stenosis. No dissection or fibromuscular disease.  Right vertebral: No ostial stenosis. No segmental narrowing in the neck. This vessel is continuous with and the dominant contributor to the basilar. Moderate dolichoectasia.  Left vertebral: Hypoplastic. No ostial lesion. Predominant contribution is to the PICA.  Review of the MIP images confirms the above findings.  IMPRESSION: No extracranial flow reducing lesion. No intracranial stenosis, thrombus, or occlusion.  No post infusion abnormalities of the brain.  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  Critical Value/emergent results were called by telephone at the time of interpretation on 08/29/2013 at 5:45 PM to Dr. Alexis Goodell , who verbally acknowledged these results.   Electronically Signed   By: Rolla Flatten M.D.   On: 08/29/2013 18:01   Ct Head Wo Contrast  08/29/2013   CLINICAL DATA:  Left-sided weakness  EXAM: CT HEAD WITHOUT CONTRAST  TECHNIQUE: Contiguous axial images were obtained from the base  of the skull through the vertex without intravenous contrast.  COMPARISON:  None.  FINDINGS: There is no evidence of mass effect, midline shift or extra-axial fluid collections. There is no evidence of a space-occupying lesion or intracranial hemorrhage. There is no evidence of a cortical-based area of acute infarction.  The ventricles and sulci are appropriate for the patient's age. The basal cisterns are patent.  Visualized portions of the orbits are unremarkable. The visualized portions of the paranasal sinuses and mastoid air cells are unremarkable.  The osseous structures are unremarkable.  IMPRESSION: No acute intracranial pathology. These results were called by telephone at the time of interpretation on 08/29/2013 at 4:44 PM to Dr. Alexis Goodell, Who verbally acknowledged these results.   Electronically Signed   By: Kathreen Devoid   On: 08/29/2013 16:46   Ct Angio Neck W/cm &/or Wo/cm  08/29/2013   CLINICAL DATA:  Sudden onset of dizziness and diaphoresis. Blurred vision. Left-sided facial numbness and tingling.  EXAM: CT ANGIOGRAPHY HEAD AND NECK  TECHNIQUE: Multidetector CT imaging of the head and neck was performed using the standard protocol during bolus administration of intravenous contrast. Multiplanar CT image reconstructions and MIPs were obtained to evaluate the vascular anatomy. Carotid stenosis measurements (when applicable) are obtained utilizing NASCET criteria, using the distal internal carotid diameter as the denominator.  CONTRAST:  84mL OMNIPAQUE IOHEXOL 350 MG/ML SOLN  COMPARISON:  CT head 08/29/2013 at 1639 hr.  FINDINGS: CTA HEAD FINDINGS  Noncontrast study was performed earlier and was within normal limits. Post infusion, no abnormal enhancement of the brain or meninges.  Right carotid: Skull base, cavernous, and supra clinoid segments demonstrate minor calcific atheromatous change. No carotid stenosis is evident. No ACA or MCA lesion on the right.  Left carotid: Skull base, cavernous,  and supra clinoid segments demonstrate minor calcific atheromatous change. No carotid stenosis is evident. There is no ACA or MCA lesion.  Both posterior cerebral arteries originate from the carotid. There is a rudimentary connection to a hypoplastic, but otherwise unremarkable, basilar artery. No intracranial stenosis of the right or left distal vertebrals. No evidence for basilar dissection or thrombosis.  Review of the MIP images confirms the above findings.  CTA NECK FINDINGS  Lung apices: Scattered air cysts without pulmonary nodule or pneumothorax.  Neck soft tissues: Enlarged thyroid with multiple calcified and noncalcified nodules, likely goiter. No tracheal deviation.  Proximal vasculature: Moderate atheromatous change transverse arch. Conventional branching of the great vessels without proximal lesion  Right carotid: Mild non stenotic atheromatous change at the carotid bifurcation. No dissection or fibromuscular dysplasia.  Left carotid: Moderate calcific change at the bifurcation without ulceration or stenosis. No dissection or fibromuscular disease.  Right vertebral: No ostial stenosis. No segmental narrowing in the neck. This vessel is continuous with and the dominant contributor to the basilar. Moderate dolichoectasia.  Left vertebral: Hypoplastic. No ostial lesion. Predominant contribution is to the PICA.  Review of the MIP images confirms the above findings.  IMPRESSION: No extracranial  flow reducing lesion. No intracranial stenosis, thrombus, or occlusion.  No post infusion abnormalities of the brain.  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  Critical Value/emergent results were called by telephone at the time of interpretation on 08/29/2013 at 5:45 PM to Dr. Alexis Goodell , who verbally acknowledged these results.   Electronically Signed   By: Rolla Flatten M.D.   On: 08/29/2013 18:01   Mr Brain Wo Contrast  08/29/2013   CLINICAL DATA:  Left cheek and jaw numbness, radiating  to the left shoulder. History of hypertension and diabetes.  EXAM: MRI HEAD WITHOUT CONTRAST  TECHNIQUE: Multiplanar, multiecho pulse sequences of the brain and surrounding structures were obtained without intravenous contrast.  COMPARISON:  CT angiography of the head neck performed earlier.  FINDINGS: No evidence for acute infarction, hemorrhage, mass lesion, hydrocephalus, or extra-axial fluid. Normal for age cerebral volume. Tiny lacunar infarct affects the left anterior thalamus, but is not acute. No appreciable white matter in the periventricular or subcortical regions. Mildly prominent perivascular spaces. No foci of chronic hemorrhage. Flow voids are maintained in the carotid, basilar, and right vertebral arteries. Bilateral fetal PCA origins. The left vertebral is diminutive and likely supplies only the PICA. Partial empty sella. Mild cervical spondylosis. No tonsillar herniation. Shotty cervical adenopathy. Fluid layering in the left maxillary sinus suggest acuity. Mild ethmoid fluid bilaterally. There is some type of benign appearing 6 mm cyst which projects lateral to the orbit on the left, possible tarsal lesion. Otherwise negative orbits. No mastoid fluid.  IMPRESSION: Unremarkable appearing cranial MRI without acute intracranial abnormality.  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  Acute left maxillary sinus disease is suspected.   Electronically Signed   By: Rolla Flatten M.D.   On: 08/29/2013 21:11    5:35 PM patient alert Glasgow Coma Score 15 now asymptomatic. 10:15 PM patient remains asymptomatic she is alert and relates no difficulty. MDM  Code stroke called upon her arrival Final diagnoses:  None   spoke with neurology. Plan aspirin 325 mg daily. She should follow up with her primary care physician Patient reports that she's been under a lot of stress mostly. I suspect that she had anxiety in light of today's negative neurologic evaluation Diagnosis  paresthesias    Orlie Dakin, MD 08/29/13 2220

## 2013-08-29 NOTE — Discharge Instructions (Signed)
Take aspirin 325 mg daily. Contact your primary care physician tomorrow to let him know that your here. Return if your condition worsens for any reason. Your testing today showed no evidence of stroke

## 2013-11-15 ENCOUNTER — Emergency Department: Payer: Self-pay | Admitting: Emergency Medicine

## 2013-11-15 LAB — CBC
HCT: 43.8 % (ref 35.0–47.0)
HGB: 14.1 g/dL (ref 12.0–16.0)
MCH: 27.8 pg (ref 26.0–34.0)
MCHC: 32.3 g/dL (ref 32.0–36.0)
MCV: 86 fL (ref 80–100)
Platelet: 411 10*3/uL (ref 150–440)
RBC: 5.09 10*6/uL (ref 3.80–5.20)
RDW: 14.2 % (ref 11.5–14.5)
WBC: 8.2 10*3/uL (ref 3.6–11.0)

## 2013-11-15 LAB — COMPREHENSIVE METABOLIC PANEL
Albumin: 4.1 g/dL (ref 3.4–5.0)
Alkaline Phosphatase: 105 U/L
Anion Gap: 8 (ref 7–16)
BILIRUBIN TOTAL: 0.8 mg/dL (ref 0.2–1.0)
BUN: 18 mg/dL (ref 7–18)
CALCIUM: 8.7 mg/dL (ref 8.5–10.1)
CHLORIDE: 104 mmol/L (ref 98–107)
CREATININE: 0.79 mg/dL (ref 0.60–1.30)
Co2: 27 mmol/L (ref 21–32)
EGFR (Non-African Amer.): >60
Glucose: 140 mg/dL — ABNORMAL HIGH (ref 65–99)
Osmolality: 282 (ref 275–301)
POTASSIUM: 3.5 mmol/L (ref 3.5–5.1)
SGOT(AST): 34 U/L (ref 15–37)
SGPT (ALT): 40 U/L
SODIUM: 139 mmol/L (ref 136–145)
TOTAL PROTEIN: 8.1 g/dL (ref 6.4–8.2)

## 2013-11-15 LAB — SALICYLATE LEVEL: Salicylates, Serum: <1.7 mg/dL

## 2013-11-15 LAB — ACETAMINOPHEN LEVEL: Acetaminophen: <2 ug/mL

## 2013-11-15 LAB — URINALYSIS, COMPLETE
Bilirubin,UR: NEGATIVE
Blood: NEGATIVE
Glucose,UR: NEGATIVE mg/dL (ref 0–75)
Ketone: NEGATIVE
LEUKOCYTE ESTERASE: NEGATIVE
Nitrite: NEGATIVE
PH: 5 (ref 4.5–8.0)
Protein: NEGATIVE
RBC,UR: <1 /HPF (ref 0–5)
Specific Gravity: 1.011 (ref 1.003–1.030)
Squamous Epithelial: 1

## 2013-11-15 LAB — DRUG SCREEN, URINE
AMPHETAMINES, UR SCREEN: NEGATIVE (ref ?–1000)
BARBITURATES, UR SCREEN: NEGATIVE (ref ?–200)
Benzodiazepine, Ur Scrn: POSITIVE (ref ?–200)
Cannabinoid 50 Ng, Ur ~~LOC~~: NEGATIVE (ref ?–50)
Cocaine Metabolite,Ur ~~LOC~~: NEGATIVE (ref ?–300)
MDMA (ECSTASY) UR SCREEN: NEGATIVE (ref ?–500)
Methadone, Ur Screen: NEGATIVE (ref ?–300)
Opiate, Ur Screen: POSITIVE (ref ?–300)
Phencyclidine (PCP) Ur S: NEGATIVE (ref ?–25)
Tricyclic, Ur Screen: NEGATIVE (ref ?–1000)

## 2013-11-15 LAB — ETHANOL: Ethanol: <3 mg/dL

## 2014-06-16 NOTE — Consult Note (Signed)
PATIENT NAME:  Ellen Taylor, Ellen Taylor MR#:  [id number] DATE OF BIRTH:  [birth year]  DATE OF CONSULTATION:  11/15/2013  CONSULTING PHYSICIAN: Gonzella Lex, M.D.   IDENTIFYING INFORMATION AND REASON FOR CONSULTATION: A 59 year old woman with a history of depression, who comes in the hospital voluntarily seeking treatment for depression.   CHIEF COMPLAINT: "I need a psychiatrist."   HISTORY OF PRESENT ILLNESS: Information obtained from the patient and the chart. The patient describes symptoms of depressed mood and anxiety that had been going on for a couple of years, but have been getting remarkably worse over the last 2 or 3 months. She feels depressed most of the time. Feels overwhelmed. Cognitively, feels slowed, confused, and impaired. Sleeps only a couple of hours a night. Energy is low. Not really enjoying many activities. Denies any psychotic symptoms. The patient says the symptoms have been getting worse recently. A major trigger is that she is the primary caregiver for her husband who has stage IV cancer, her mother who is demented, and she has a 46 year old son who is an alcoholic and is at home and abusive. The patient feels like she cannot ever leave the home because of the needs that everyone places on her and that she is getting no appreciation or gratitude for it. Additionally, she has multiple medical problems of her own, mostly rheumatoid arthritis and other musculoskeletal pain problems. She denies any alcohol or drug abuse.   PAST PSYCHIATRIC HISTORY: Has had 1 previous psychiatric hospitalization at [facility] years ago. May have also been at that time at [facility] Was treated with Prozac. Got some relief from it. More recently, her primary care doctor has been treating her with bupropion and a low dose of Xanax at night. She has 1 distant episode of overdose, no other suicide attempts. She does not have a history of psychotic disorder.   FAMILY HISTORY: No known family history of mental  illness except for a son who has an alcohol problem.   PAST MEDICAL HISTORY: Has rheumatoid arthritis with chronic pain. Also, osteoarthritis, degenerative disk disease. Significant chronic pain most of the time. Feels like her rheumatoid arthritis is getting worse.   CURRENT MEDICATIONS: Prednisone 5 mg once a day, hydrocodone/acetaminophen 10 mg one every 6 hours as needed, aspirin 325 mg a day, meloxicam 7.5 mg a day, lisinopril 10 mg a day, gabapentin 300 mg 3 times a day, glimepiride 4 mg once a day, pravastatin 10 mg once a day, hydrochloroquine 200 mg once a day, methotrexate 2.5 mg tablets 8 once a week, Xanax 1 mg at night, Linzess 145 mcg once a day, bupropion 150 mg extended-release once a day, folic acid 1 mg once a day.   ALLERGIES: METFORMIN.   SOCIAL HISTORY: Not employed outside the home. Lives with her husband, mother, and son. Primary caregiver for all of them. Feels like she has minimal support and no help outside.   REVIEW OF SYSTEMS:  Depressed mood. Frequent crying. Sadness. Poor sleep at night. Cognitive slowing. Denies any suicidal intention or plan. Denies any hallucinations. Chronic pain in all of her joints, especially her hands. Otherwise, full 9-point review of systems negative.   MENTAL STATUS EXAMINATION: Reasonably well-groomed woman in mild distress, looks her stated age. Cooperative with the interview. Good eye contact. Normal psychomotor activity. Speech normal rate, tone, and volume. Affect sad, tearful, appropriate. Mood stated as depressed. Thoughts are lucid without loosening of associations. No sign of delusions. Denies auditory or visual hallucinations. Denies suicidal or  homicidal ideation. Could recall 3/3 objects immediately and in 3 minutes. Long-term memory intact. Judgment and insight good. Alert and oriented x4.   PHYSICAL EXAMINATION:  GENERAL: The patient appears to be in maybe some mild distress with chronic pain as she is sitting still.  VITAL  SIGNS:  Current blood pressure 138/90, respirations 18, pulse 95, temperature 98.8. NEUROLOGIC: Able to walk and move all extremities.   LABORATORY RESULTS: Salicylates and acetaminophen negative. Alcohol negative. Chemistry panel just an elevated glucose at 140.  CBC normal. Urinalysis normal. Drug screen positive for opiates and benzodiazepines.   ASSESSMENT: A 59 year old woman who appears to have a major depression. Not psychotic. Multiple medical problems. Rheumatoid arthritis, chronic pain. Also, has major life stresses as detailed above. Currently, denies suicidal ideation. Could possibly be a reasonable admission to the hospital, but she is not under commitment and does not currently meet commitment criteria. I offered her voluntary admission and she declines it at this point because of her responsibilities at home. She is, however, very open and desirous of medical and psychiatric treatment.   TREATMENT PLAN: Discussed with the ER doctor. I am going to start her on Prozac 20 mg per day. Side effects discussed and the patient agrees to the plan. Do not stop or change other medicine. She will be given referral information to [facility] or to [facility] for followup treatment. Lots of psychoeducation and encouragement completed.   DIAGNOSIS, PRINCIPAL AND PRIMARY:   AXIS I: Major depression, severe, recurrent.   SECONDARY DIAGNOSES: AXIS I: Panic attacks, recent.   AXIS II: Deferred.   AXIS III: Rheumatoid arthritis, chronic pain, osteoarthritis.    ____________________________ Gonzella Lex, MD jtc:lr D: 11/15/2013 18:38:10 ET T: 11/15/2013 18:57:13 ET JOB#: [id number]  cc: Gonzella Lex, MD, <Dictator> Gonzella Lex MD ELECTRONICALLY SIGNED 11/16/2013 0:13

## 2014-06-16 NOTE — Consult Note (Signed)
Brief Consult Note: Diagnosis: major depression.   Patient was seen by consultant.   Consult note dictated.   Orders entered.   Discussed with Attending MD.   Comments: PSychiatry: PAtient seen. Chart reviewed. Note dictated. Discussed with ER MD. [place] will be started on prozac 20mg  qday and referred to outpt treatment. Can be discharged home.  Electronic Signatures: Dalisha Shively, Madie Reno (MD)  (Signed 23-Sep-15 18:29)  Authored: Brief Consult Note   Last Updated: 23-Sep-15 18:29 by Gonzella Lex (MD)

## 2015-04-02 ENCOUNTER — Other Ambulatory Visit: Payer: Self-pay | Admitting: Physical Medicine and Rehabilitation

## 2015-04-02 DIAGNOSIS — M5416 Radiculopathy, lumbar region: Secondary | ICD-10-CM

## 2015-04-03 ENCOUNTER — Ambulatory Visit
Admission: RE | Admit: 2015-04-03 | Discharge: 2015-04-03 | Disposition: A | Discharge status: Home / Self Care | Payer: Medicare Other | Source: Ambulatory Visit | Attending: Physical Medicine and Rehabilitation | Admitting: Physical Medicine and Rehabilitation

## 2015-04-03 DIAGNOSIS — M5416 Radiculopathy, lumbar region: Secondary | ICD-10-CM | POA: Diagnosis present

## 2015-06-27 MED ORDER — PENTAFLUOROPROP-TETRAFLUOROETH EX AERO
INHALATION_SPRAY | CUTANEOUS | Status: AC
  Filled 2015-06-27: qty 30

## 2015-09-11 ENCOUNTER — Encounter: Payer: Self-pay | Admitting: Obstetrics and Gynecology

## 2015-09-11 ENCOUNTER — Ambulatory Visit (INDEPENDENT_AMBULATORY_CARE_PROVIDER_SITE_OTHER): Payer: Medicare Other | Admitting: Obstetrics and Gynecology

## 2015-09-11 VITALS — BP 131/77 | HR 82 | Ht 62.0 in | Wt 181.3 lb

## 2015-09-11 DIAGNOSIS — E669 Obesity, unspecified: Secondary | ICD-10-CM

## 2015-09-11 DIAGNOSIS — I10 Essential (primary) hypertension: Secondary | ICD-10-CM

## 2015-09-11 DIAGNOSIS — E1169 Type 2 diabetes mellitus with other specified complication: Secondary | ICD-10-CM | POA: Insufficient documentation

## 2015-09-11 DIAGNOSIS — E119 Type 2 diabetes mellitus without complications: Secondary | ICD-10-CM | POA: Diagnosis not present

## 2015-09-11 DIAGNOSIS — N950 Postmenopausal bleeding: Secondary | ICD-10-CM | POA: Insufficient documentation

## 2015-09-11 MED ORDER — NYSTATIN-TRIAMCINOLONE 100000-0.1 UNIT/GM-% EX CREA: 1.0000 | TOPICAL_CREAM | Freq: Two times a day (BID) | CUTANEOUS | Status: DC

## 2015-09-11 NOTE — Patient Instructions (Signed)

## 2015-09-11 NOTE — Progress Notes (Signed)
GYN ENCOUNTER NOTE  Subjective:       Ellen Taylor is a 60 y.o. [phone] female is here for gynecologic evaluation of the following issues:  1. Postmenopausal bleeding  Menopausal at age 77-7 years ago Episode of postmenopausal bleeding in June 2017, menstrual type bleeding for 8 days associated with cramps No HRT therapy.    Comorbidities include hypertension, type 2 diabetes mellitus, hyperlipidemia  Gynecologic History No LMP recorded. Patient is postmenopausal. Contraception: post menopausal status   Obstetric History OB History  Gravida Para Term Preterm AB SAB TAB Ectopic Multiple Living  2 1  1 1 1    1     # Outcome Date GA Lbr Len/2nd Weight Sex Delivery Anes PTL Lv  2 Preterm 1993   2 lb 4.8 oz (1.043 kg) M CS-LTranv   Y  1 SAB 1991              Past Medical History  Diagnosis Date  . Diabetes mellitus without complication ([facility])   . Hypertension   . Hyperlipemia   . Cancer ([place])     kidney cancer, 2012. Partial removal of left kidney     Past Surgical History  Procedure Laterality Date  . Cholecystectomy    . Cesarean section      Current Outpatient Prescriptions on File Prior to Visit  Medication Sig Dispense Refill  . B Complex Vitamins (VITAMIN-B COMPLEX PO) Take 1 tablet by mouth daily.    Marland Kitchen buPROPion (WELLBUTRIN XL) 150 MG 24 hr tablet Take 150 mg by mouth daily.    . Cholecalciferol (VITAMIN D) 2000 UNITS CAPS Take 1 capsule by mouth daily.    . cyclobenzaprine (FLEXERIL) 5 MG tablet Take 5 mg by mouth 3 (three) times daily as needed for muscle spasms.    . folic acid (FOLVITE) 1 MG tablet Take 1 mg by mouth daily.    Marland Kitchen glimepiride (AMARYL) 4 MG tablet Take 4 mg by mouth daily with breakfast.    . HYDROcodone-acetaminophen (NORCO) 10-325 MG per tablet Take 1 tablet by mouth every 6 (six) hours as needed for moderate pain.     . Linaclotide (LINZESS) 145 MCG CAPS capsule Take 145 mcg by mouth as needed (constipation).     Marland Kitchen lisinopril  (PRINIVIL,ZESTRIL) 10 MG tablet Take 10 mg by mouth daily.    . meloxicam (MOBIC) 7.5 MG tablet Take 7.5 mg by mouth daily.    . methotrexate (RHEUMATREX) 2.5 MG tablet Take 20 mg by mouth once a week. Caution:Chemotherapy. Protect from light.     No current facility-administered medications on file prior to visit.    Allergies  Allergen Reactions  . Metformin And Related Nausea Only    Social History   Social History  . Marital Status: Married    Spouse Name: N/A  . Number of Children: N/A  . Years of Education: N/A   Occupational History  . Not on file.   Social History Main Topics  . Smoking status: Former Smoker -- 0.50 packs/day for 35 years    Types: Cigarettes    Quit date: 02/23/2013  . Smokeless tobacco: Not on file  . Alcohol Use: No  . Drug Use: No  . Sexual Activity: Not Currently    Birth Control/ Protection: Post-menopausal   Other Topics Concern  . Not on file   Social History Narrative    Family History  Problem Relation Age of Onset  . Diabetes Mother   . Heart disease Father   .  Breast cancer Sister   . Diabetes Brother   . Heart disease Brother   . Breast cancer Maternal Aunt   . Diabetes Maternal Aunt   . Rashes / Skin problems Neg Hx     The following portions of the patient's history were reviewed and updated as appropriate: allergies, current medications, past family history, past medical history, past social history, past surgical history and problem list.  Review of Systems Review of Systems -Per history of present illness  Objective:   BP 131/77 mmHg  Pulse 82  Ht 5\' 2"  (1.575 m)  Wt 181 lb 4.8 oz (82.237 kg)  BMI 33.15 kg/m2 CONSTITUTIONAL: Well-developed, well-nourished female in no acute distress.  HENT:  Normocephalic, atraumatic.  NECK: Normal range of motion, supple, no masses.  Normal thyroid.  SKIN: Skin is warm and dry. No rash noted. Not diaphoretic. No erythema. No pallor. Jansen: Alert and oriented to person,  place, and time. PSYCHIATRIC: Normal mood and affect. Normal behavior. Normal judgment and thought content. CARDIOVASCULAR:Not Examined RESPIRATORY: Not Examined BREASTS: Not Examined ABDOMEN: Soft, non distended; Non tender.  No Organomegaly. PELVIC:  External Genitalia: Normal  BUS: Normal  Vagina: Normal  Cervix: Normal  Uterus: Normal size, shape,consistency, mobile; midplane  Adnexa: Normal; nonpalpable and nontender  RV: Normal external exam  Bladder: Nontender MUSCULOSKELETAL: Normal range of motion. No tenderness.  No cyanosis, clubbing, or edema.  PROCEDURE: Endometrial biopsy Endometrial Biopsy Procedure Note  Pre-operative Diagnosis: Postmenopausal bleeding  Post-operative Diagnosis: Postmenopausal bleeding  Procedure Details   Urine pregnancy test was not done.  The risks (including infection, bleeding, pain, and uterine perforation) and benefits of the procedure were explained to the patient and Verbal informed consent was obtained.  Antibiotic prophylaxis against endocarditis was not indicated.   The patient was placed in the dorsal lithotomy position.  Bimanual exam showed the uterus to be in the neutral position.  A Graves' speculum inserted in the vagina.  A sharp tenaculum was applied to the anterior lip of the cervix for stabilization.  A sterile uterine sound was used to sound the uterus to a depth of 8cm.  A Mylex 61mm curette was used to sample the endometrium; scant tissue was obtained.  Sample was sent for pathologic examination.  Condition: Stable  Complications: None  Plan:  The patient was advised to call for any fever or for prolonged or severe pain or bleeding. She was advised to use OTC acetaminophen and OTC ibuprofen as needed for mild to moderate pain. She was advised to avoid vaginal intercourse for 48 hours or until the bleeding has completely stopped.  Attending Physician Documentation: Brayton Mars, MD    Assessment:   1.  Postmenopausal bleeding - US Pelvis Complete; Future - US Transvaginal Non-OB; Future     Plan:   1. Endometrial biopsy 2. Pelvic ultrasound 3. Return in 3 weeks for follow-up on results  Brayton Mars, MD  Note: This dictation was prepared with Dragon dictation along with smaller phrase technology. Any transcriptional errors that result from this process are unintentional.

## 2015-09-12 ENCOUNTER — Ambulatory Visit (INDEPENDENT_AMBULATORY_CARE_PROVIDER_SITE_OTHER): Payer: Medicare Other

## 2015-09-12 DIAGNOSIS — N950 Postmenopausal bleeding: Secondary | ICD-10-CM

## 2015-09-15 LAB — PATHOLOGY

## 2015-10-08 ENCOUNTER — Ambulatory Visit (INDEPENDENT_AMBULATORY_CARE_PROVIDER_SITE_OTHER): Payer: Medicare Other | Admitting: Obstetrics and Gynecology

## 2015-10-08 VITALS — BP 117/76 | HR 86 | Ht 62.0 in | Wt 181.0 lb

## 2015-10-08 DIAGNOSIS — N840 Polyp of corpus uteri: Secondary | ICD-10-CM

## 2015-10-08 DIAGNOSIS — N950 Postmenopausal bleeding: Secondary | ICD-10-CM | POA: Diagnosis not present

## 2015-10-08 NOTE — Progress Notes (Signed)
Chief complaint: 1. Postmenopausal bleeding  Patient presents for follow-up on postmenopausal bleeding. Since the endometrial biopsy and ultrasound patient has not had any further bleeding.  Pelvic ultrasound: ULTRASOUND REPORT  Location: [facility] Date of Service: 09/12/15   Indications:PMB Findings:  The uterus measures 6.1 x 2.5 x 3.9cm. Echo texture is homogenous without evidence of focal masses.  The Endometrium measures 1.7 mm.  Right Ovary measures 1.8 x .9 x 1.5 cm. It is normal in appearance. Left Ovary measures 1.5 x 1.4 x 1.5 cm. It is normal appearance. Survey of the adnexa demonstrates no adnexal masses. There is no free fluid in the cul de sac.  Impression: 1. WNL  Recommendations: 1.Clinical correlation with the patient's History and Physical Exam.   Ellen Taylor  Brayton Mars, MD  Pathology: ENDOMETRIUM, BIOPSY:  POLYPOID FRAGMENTS OF WEAKLY PROLIFERATIVE ENDOMETRIUM SUGGESTIVE OF  BENIGN ENDOMETRIAL POLYPS. NO HYPERPLASIA OR CARCINOMA.  BSR/09/15/2015   OBJECTIVE: BP 117/76   Pulse 86   Ht 5\' 2"  (1.575 m)   Wt 181 lb (82.1 kg)   BMI 33.11 kg/m  Physical exam-deferred  ASSESSMENT: 1. Postmenopausal bleeding 2. Endometrial polyps without evidence of hyperplasia or carcinoma 3. Normal pelvic ultrasound  PLAN: 1. Monitor for any further bleeding. 2. Should there be any further bleeding after 02/24/2016, contact the office for further evaluation and probable D&C 3. All questions addressed; findings explained; management explained  A total of 15 minutes were spent face-to-face with the patient during this encounter and over half of that time dealt with counseling and coordination of care.  Brayton Mars, MD  Note: This dictation was prepared with Dragon dictation along with smaller phrase technology. Any transcriptional errors that result from this process are unintentional.

## 2015-10-08 NOTE — Patient Instructions (Signed)
1. Endometrial biopsy result was benign showing endometrial polyps without cancer or precancer 2. Ultrasound demonstrated a normal uterus and endometrial stripe 3. No further evaluation is necessary at this time. 4. Should you develop recurrent postmenopausal bleeding after 02/24/2016, contact us for reassessment and probable need for D&C.

## 2016-02-25 ENCOUNTER — Other Ambulatory Visit: Payer: Self-pay | Admitting: Physical Medicine and Rehabilitation

## 2016-02-25 DIAGNOSIS — M5416 Radiculopathy, lumbar region: Secondary | ICD-10-CM

## 2016-02-25 DIAGNOSIS — M48062 Spinal stenosis, lumbar region with neurogenic claudication: Secondary | ICD-10-CM

## 2016-02-25 DIAGNOSIS — M5136 Other intervertebral disc degeneration, lumbar region: Secondary | ICD-10-CM

## 2016-03-02 ENCOUNTER — Ambulatory Visit
Admission: RE | Admit: 2016-03-02 | Discharge: 2016-03-02 | Disposition: A | Discharge status: Home / Self Care | Payer: Medicare Other | Source: Ambulatory Visit | Attending: Physical Medicine and Rehabilitation | Admitting: Physical Medicine and Rehabilitation

## 2016-03-02 DIAGNOSIS — M5126 Other intervertebral disc displacement, lumbar region: Secondary | ICD-10-CM | POA: Insufficient documentation

## 2016-03-02 DIAGNOSIS — M48062 Spinal stenosis, lumbar region with neurogenic claudication: Secondary | ICD-10-CM | POA: Diagnosis present

## 2016-03-02 DIAGNOSIS — M5136 Other intervertebral disc degeneration, lumbar region: Secondary | ICD-10-CM | POA: Insufficient documentation

## 2016-03-02 DIAGNOSIS — M5187 Other intervertebral disc disorders, lumbosacral region: Secondary | ICD-10-CM | POA: Insufficient documentation

## 2016-03-02 DIAGNOSIS — M5416 Radiculopathy, lumbar region: Secondary | ICD-10-CM

## 2016-03-02 DIAGNOSIS — Z981 Arthrodesis status: Secondary | ICD-10-CM | POA: Diagnosis not present

## 2016-08-31 ENCOUNTER — Other Ambulatory Visit: Payer: Self-pay | Admitting: Physician Assistant

## 2016-08-31 DIAGNOSIS — Z1231 Encounter for screening mammogram for malignant neoplasm of breast: Secondary | ICD-10-CM

## 2016-09-09 ENCOUNTER — Ambulatory Visit
Admission: RE | Admit: 2016-09-09 | Discharge: 2016-09-09 | Disposition: A | Discharge status: Home / Self Care | Payer: Medicare Other | Source: Ambulatory Visit | Attending: Physician Assistant | Admitting: Physician Assistant

## 2016-09-09 DIAGNOSIS — Z1231 Encounter for screening mammogram for malignant neoplasm of breast: Secondary | ICD-10-CM | POA: Diagnosis not present

## 2017-05-21 ENCOUNTER — Other Ambulatory Visit: Payer: Self-pay | Admitting: Student

## 2017-05-21 DIAGNOSIS — G5701 Lesion of sciatic nerve, right lower limb: Secondary | ICD-10-CM

## 2017-05-25 ENCOUNTER — Ambulatory Visit
Admission: RE | Admit: 2017-05-25 | Discharge: 2017-05-25 | Disposition: A | Discharge status: Home / Self Care | Payer: Medicare Other | Source: Ambulatory Visit | Attending: Student | Admitting: Student

## 2017-05-25 DIAGNOSIS — G5701 Lesion of sciatic nerve, right lower limb: Secondary | ICD-10-CM | POA: Insufficient documentation

## 2017-09-20 ENCOUNTER — Encounter: Payer: Self-pay | Admitting: General Surgery

## 2019-03-09 ENCOUNTER — Other Ambulatory Visit: Payer: Self-pay

## 2019-03-09 ENCOUNTER — Encounter: Payer: Self-pay | Admitting: Urology

## 2019-03-09 ENCOUNTER — Ambulatory Visit (INDEPENDENT_AMBULATORY_CARE_PROVIDER_SITE_OTHER): Payer: Medicare Other | Admitting: Urology

## 2019-03-09 VITALS — BP 124/70 | HR 74 | Ht 62.0 in | Wt 169.0 lb

## 2019-03-09 DIAGNOSIS — Z85528 Personal history of other malignant neoplasm of kidney: Secondary | ICD-10-CM | POA: Diagnosis not present

## 2019-03-09 DIAGNOSIS — D179 Benign lipomatous neoplasm, unspecified: Secondary | ICD-10-CM

## 2019-03-09 NOTE — Progress Notes (Signed)
03/09/2019 9:44 AM   Ellen Taylor July 27, 1955 [id number]  Referring provider: Bonnita Nasuti, MD [address] [place],  [place] [address]  Chief Complaint  Patient presents with  . Other    Urologic history: 1.  History of renal cell carcinoma  -status post left partial nephrectomy 12/2010 [place]  2.  Right renal angiomyolipoma   HPI: Ellen Taylor is a 64 y.o. female who presents to establish local urologic care.  She was a former patient of Dr. Jacqlyn Larsen who she last saw at [facility] March 2019.  She has a small angiomyolipoma of the right kidney and a history of renal cell carcinoma.  She presently is doing well.  She denies flank, abdominal or pelvic pain.  Denies gross hematuria.  She has mild stress incontinence and urinary frequency which are not bothersome.   PMH: Past Medical History:  Diagnosis Date  . Cancer ([facility])    kidney cancer, 2012. Partial removal of left kidney   . Diabetes mellitus without complication ([facility])   . Hyperlipemia   . Hypertension     Surgical History: Past Surgical History:  Procedure Laterality Date  . BREAST EXCISIONAL BIOPSY Left 20+ YRS AGO   NEG  . CESAREAN SECTION    . CHOLECYSTECTOMY      Home Medications:  Allergies as of 03/09/2019      Reactions   Metformin And Related Nausea Only      Medication List       Accurate as of March 09, 2019  9:44 AM. If you have any questions, ask your nurse or doctor.        STOP taking these medications   buPROPion 150 MG 24 hr tablet Commonly known as: WELLBUTRIN XL Stopped by: Abbie Sons, MD   FLUoxetine 40 MG capsule Commonly known as: PROZAC Stopped by: Abbie Sons, MD   Linzess 145 MCG Caps capsule Generic drug: linaclotide Stopped by: Abbie Sons, MD     TAKE these medications   aspirin 81 MG EC tablet Take by mouth.   cyclobenzaprine 5 MG tablet Commonly known as: FLEXERIL Take 5 mg by mouth 3 (three) times daily as needed for muscle spasms.     fluticasone 50 MCG/ACT nasal spray Commonly known as: FLONASE Place into the nose.   folic acid 1 MG tablet Commonly known as: FOLVITE Take 1 mg by mouth daily.   gabapentin 300 MG capsule Commonly known as: NEURONTIN Take 300 mg by mouth.   glimepiride 4 MG tablet Commonly known as: AMARYL Take 4 mg by mouth daily with breakfast.   HYDROcodone-acetaminophen 10-325 MG tablet Commonly known as: NORCO Take 1 tablet by mouth every 6 (six) hours as needed for moderate pain.   hydroxychloroquine 200 MG tablet Commonly known as: PLAQUENIL Take 200 mg by mouth.   lisinopril 10 MG tablet Commonly known as: ZESTRIL Take 10 mg by mouth daily.   Melatonin 5 MG Caps Take by mouth.   meloxicam 7.5 MG tablet Commonly known as: MOBIC Take 7.5 mg by mouth daily.   methotrexate 2.5 MG tablet Commonly known as: RHEUMATREX Take 20 mg by mouth once a week. Caution:Chemotherapy. Protect from light.   nystatin-triamcinolone cream Commonly known as: MYCOLOG II Apply 1 application topically 2 (two) times daily.   pravastatin 20 MG tablet Commonly known as: PRAVACHOL   TRESIBA Chaves Inject into the skin.   Vitamin D 50 MCG (2000 UT) Caps Take 1 capsule by mouth daily.   VITAMIN-B COMPLEX  PO Take 1 tablet by mouth daily.       Allergies:  Allergies  Allergen Reactions  . Metformin And Related Nausea Only    Family History: Family History  Problem Relation Age of Onset  . Diabetes Mother   . Heart disease Father   . Breast cancer Sister   . Diabetes Brother   . Heart disease Brother   . Breast cancer Maternal Aunt   . Diabetes Maternal Aunt   . Rashes / Skin problems Neg Hx     Social History:  reports that she quit smoking about 6 years ago. Her smoking use included cigarettes. She has a 17.50 pack-year smoking history. She has never used smokeless tobacco. She reports that she does not drink alcohol or use drugs.  ROS: UROLOGY Frequent Urination?: Yes Hard to  postpone urination?: No Burning/pain with urination?: No Get up at night to urinate?: No Leakage of urine?: Yes Urine stream starts and stops?: No Trouble starting stream?: No Do you have to strain to urinate?: No Blood in urine?: No Urinary tract infection?: No Sexually transmitted disease?: No Injury to kidneys or bladder?: No Painful intercourse?: No Weak stream?: No Currently pregnant?: No Vaginal bleeding?: No Last menstrual period?: N  Gastrointestinal Nausea?: No Vomiting?: No Indigestion/heartburn?: No Diarrhea?: No Constipation?: Yes  Constitutional Fever: No Night sweats?: No Weight loss?: No Fatigue?: No  Skin Skin rash/lesions?: No Itching?: No  Eyes Blurred vision?: No Double vision?: No  Ears/Nose/Throat Sore throat?: No Sinus problems?: No  Hematologic/Lymphatic Swollen glands?: No Easy bruising?: No  Cardiovascular Leg swelling?: No Chest pain?: No  Respiratory Cough?: No Shortness of breath?: No  Endocrine Excessive thirst?: No  Musculoskeletal Back pain?: Yes Joint pain?: Yes  Neurological Headaches?: No Dizziness?: No  Psychologic Depression?: Yes Anxiety?: No  Physical Exam: BP 124/70   Pulse 74   Ht 5\' 2"  (1.575 m)   Wt 169 lb (76.7 kg)   BMI 30.91 kg/m   Constitutional:  Alert and oriented, No acute distress. HEENT: [place] AT, moist mucus membranes.  Trachea midline, no masses. Cardiovascular: No clubbing, cyanosis, or edema. Respiratory: Normal respiratory effort, no increased work of breathing. GI: Abdomen is soft, nontender, nondistended, no abdominal masses GU: No CVA tenderness Lymph: No cervical or inguinal lymphadenopathy. Skin: No rashes, bruises or suspicious lesions. Neurologic: Grossly intact, no focal deficits, moving all 4 extremities. Psychiatric: Normal mood and affect.   Assessment & Plan:    - History renal cell carcinoma She is 8 years out from her surgery and doing well  - Right renal  angiomyolipoma Last imaging was 2019.  Schedule renal ultrasound.  Continue annual follow-up  Abbie Sons, MD  [facility] [address], [place], [place] [address] [phone]

## 2019-06-05 ENCOUNTER — Ambulatory Visit: Payer: Medicare Other

## 2020-03-08 ENCOUNTER — Ambulatory Visit: Payer: Self-pay | Admitting: Urology

## 2020-03-14 ENCOUNTER — Ambulatory Visit: Payer: Self-pay | Admitting: Urology

## 2020-04-11 ENCOUNTER — Ambulatory Visit: Payer: Self-pay | Admitting: Urology

## 2020-12-03 ENCOUNTER — Encounter: Payer: Self-pay | Admitting: Gastroenterology

## 2021-01-27 ENCOUNTER — Other Ambulatory Visit: Payer: Self-pay | Admitting: Internal Medicine

## 2021-01-27 DIAGNOSIS — Z1231 Encounter for screening mammogram for malignant neoplasm of breast: Secondary | ICD-10-CM

## 2022-11-30 ENCOUNTER — Other Ambulatory Visit: Payer: Self-pay | Admitting: Family

## 2022-11-30 DIAGNOSIS — Z1231 Encounter for screening mammogram for malignant neoplasm of breast: Secondary | ICD-10-CM

## 2022-12-04 ENCOUNTER — Ambulatory Visit: Payer: Medicare Other | Admitting: Urology

## 2022-12-09 ENCOUNTER — Ambulatory Visit
Admission: RE | Admit: 2022-12-09 | Discharge: 2022-12-09 | Disposition: A | Discharge status: Home / Self Care | Payer: 59 | Source: Ambulatory Visit | Attending: Family | Admitting: Family

## 2022-12-09 DIAGNOSIS — Z1231 Encounter for screening mammogram for malignant neoplasm of breast: Secondary | ICD-10-CM | POA: Diagnosis present

## 2022-12-30 ENCOUNTER — Encounter: Payer: Self-pay | Admitting: Urology

## 2022-12-30 ENCOUNTER — Ambulatory Visit: Payer: 59 | Admitting: Urology

## 2022-12-30 VITALS — BP 144/80 | HR 87 | Ht 62.0 in | Wt 172.0 lb

## 2022-12-30 DIAGNOSIS — R8281 Pyuria: Secondary | ICD-10-CM | POA: Diagnosis not present

## 2022-12-30 DIAGNOSIS — Z87440 Personal history of urinary (tract) infections: Secondary | ICD-10-CM | POA: Diagnosis not present

## 2022-12-30 DIAGNOSIS — N390 Urinary tract infection, site not specified: Secondary | ICD-10-CM

## 2022-12-30 LAB — URINALYSIS, COMPLETE
Bilirubin, UA: NEGATIVE
Ketones, UA: NEGATIVE
Nitrite, UA: NEGATIVE
RBC, UA: NEGATIVE
Specific Gravity, UA: >1.03 — ABNORMAL HIGH (ref 1.005–1.030)
Urobilinogen, Ur: 0.2 mg/dL (ref 0.2–1.0)
pH, UA: 5 (ref 5.0–7.5)

## 2022-12-30 LAB — MICROSCOPIC EXAMINATION: Epithelial Cells (non renal): >10 /[HPF] — AB (ref 0–10)

## 2022-12-30 LAB — BLADDER SCAN AMB NON-IMAGING: Scan Result: 0

## 2022-12-30 NOTE — Progress Notes (Signed)
I, Maysun Anabel Bene, acting as a scribe for Riki Altes, MD., have documented all relevant documentation on the behalf of Riki Altes, MD, as directed by Riki Altes, MD while in the presence of Riki Altes, MD.  12/30/2022 4:15 PM   [name] Sep 15, 1955 [id number]  Referring provider: Hortencia Conradi, NP [address] [place],  [place] [address]  Chief Complaint  Patient presents with   Urinary Tract Infection    HPI: Ellen Taylor is a 67 y.o. female referred for evaluation of recurrent UTI.   Limited notes were sent from her PCP. She did have symptomatic UTI September 2024 which grew ESBL E. coli.  Her symptoms included bladder pressure and pain at the end of urination. No fever, chills, or flank pain.  She states she was treated with a repeat antibiotic course. She does have mild stress and urge incontinence.  She has a history of renal cell carcinoma and status post a left partial nephrectomy in 2012 in [place] and also has a history of a small right renal angiomyolipoma   PMH: Past Medical History:  Diagnosis Date   Cancer ([facility])    kidney cancer, 2012. Partial removal of left kidney    Diabetes mellitus without complication ([facility])    Hyperlipemia    Hypertension     Surgical History: Past Surgical History:  Procedure Laterality Date   BREAST EXCISIONAL BIOPSY Left 20+ YRS AGO   NEG   CESAREAN SECTION     CHOLECYSTECTOMY      Home Medications:  Allergies as of 12/30/2022       Reactions   Metformin And Related Nausea Only        Medication List        Accurate as of December 30, 2022  4:15 PM. If you have any questions, ask your nurse or doctor.          aspirin EC 81 MG tablet Take by mouth.   cyclobenzaprine 5 MG tablet Commonly known as: FLEXERIL Take 5 mg by mouth 3 (three) times daily as needed for muscle spasms.   fluticasone 50 MCG/ACT nasal spray Commonly known as: FLONASE Place into the nose.   folic acid  1 MG tablet Commonly known as: FOLVITE Take 1 mg by mouth daily.   gabapentin 300 MG capsule Commonly known as: NEURONTIN Take 300 mg by mouth.   glimepiride 4 MG tablet Commonly known as: AMARYL Take 4 mg by mouth daily with breakfast.   HYDROcodone-acetaminophen 10-325 MG tablet Commonly known as: NORCO Take 1 tablet by mouth every 6 (six) hours as needed for moderate pain.   hydroxychloroquine 200 MG tablet Commonly known as: PLAQUENIL Take 200 mg by mouth.   lisinopril 10 MG tablet Commonly known as: ZESTRIL Take 10 mg by mouth daily.   Melatonin 5 MG Caps Take by mouth.   meloxicam 7.5 MG tablet Commonly known as: MOBIC Take 7.5 mg by mouth daily.   methotrexate 2.5 MG tablet Commonly known as: RHEUMATREX Take 20 mg by mouth once a week. Caution:Chemotherapy. Protect from light.   nystatin-triamcinolone cream Commonly known as: MYCOLOG II Apply 1 application topically 2 (two) times daily.   pravastatin 20 MG tablet Commonly known as: PRAVACHOL   TRESIBA [place]dock Inject into the skin.   Vitamin D 50 MCG (2000 UT) Caps Take 1 capsule by mouth daily.   VITAMIN-B COMPLEX PO Take 1 tablet by mouth daily.  Allergies:  Allergies  Allergen Reactions   Metformin And Related Nausea Only    Family History: Family History  Problem Relation Age of Onset   Diabetes Mother    Heart disease Father    Breast cancer Sister    Diabetes Brother    Heart disease Brother    Breast cancer Maternal Aunt    Diabetes Maternal Aunt    Rashes / Skin problems Neg Hx     Social History:  reports that she quit smoking about 9 years ago. Her smoking use included cigarettes. She started smoking about 44 years ago. She has a 17.5 pack-year smoking history. She has never used smokeless tobacco. She reports that she does not drink alcohol and does not use drugs.   Physical Exam: BP (!) 144/80   Pulse 87   Ht 5\' 2"  (1.575 m)   Wt 172 lb (78 kg)   BMI 31.46 kg/m    Constitutional:  Alert, No acute distress. HEENT: [place] AT Respiratory: Normal respiratory effort, no increased work of breathing. Psychiatric: Normal mood and affect.  Urinalysis Micro 11-30 WBC >10 epis   Assessment & Plan:    1. Urinary tract infection Based on available records, unable to ascertain if she meets the AUA criteria for recurrent UTIs in women.  Urinalysis today with pyuria, though there is squamous epithelial contamination and she is asymptomatic.  Urine culture was ordered, however if positive, will not treat based on lack of symptoms.  PVR today was 0 mL  Schedule renal ultrasound  We discussed potential preventative measures she could take including cranberry tablets and D-mannose.  We discussed the difference between symptomatic urinary tract infections and asymptomatic bacteria, and that treatment of asymptomatic bacteria is not recommended.  Will call with her renal ultrasound results and further recommendations at that time.   I have reviewed the above documentation for accuracy and completeness, and I agree with the above.   Riki Altes, MD  [facility] [address], [address] [place], [place] [address] [phone]

## 2023-01-02 LAB — CULTURE, URINE COMPREHENSIVE

## 2023-01-03 ENCOUNTER — Encounter: Payer: Self-pay | Admitting: Urology

## 2023-01-07 ENCOUNTER — Ambulatory Visit
Admission: RE | Admit: 2023-01-07 | Discharge: 2023-01-07 | Disposition: A | Discharge status: Home / Self Care | Payer: 59 | Source: Ambulatory Visit | Attending: Urology | Admitting: Urology

## 2023-01-07 DIAGNOSIS — N390 Urinary tract infection, site not specified: Secondary | ICD-10-CM | POA: Insufficient documentation

## 2023-01-20 ENCOUNTER — Other Ambulatory Visit: Payer: Self-pay | Admitting: *Deleted

## 2023-01-20 DIAGNOSIS — R1084 Generalized abdominal pain: Secondary | ICD-10-CM

## 2023-01-20 DIAGNOSIS — N218 Other lower urinary tract calculus: Secondary | ICD-10-CM

## 2023-02-01 ENCOUNTER — Ambulatory Visit
Admission: RE | Admit: 2023-02-01 | Discharge: 2023-02-01 | Disposition: A | Discharge status: Home / Self Care | Payer: 59 | Source: Ambulatory Visit | Attending: Urology | Admitting: Urology

## 2023-02-01 DIAGNOSIS — R1084 Generalized abdominal pain: Secondary | ICD-10-CM | POA: Diagnosis present

## 2023-02-01 DIAGNOSIS — N218 Other lower urinary tract calculus: Secondary | ICD-10-CM | POA: Diagnosis present

## 2023-02-18 ENCOUNTER — Encounter: Payer: Self-pay | Admitting: Urology

## 2023-07-29 ENCOUNTER — Telehealth: Payer: Self-pay

## 2023-07-29 NOTE — Telephone Encounter (Signed)
 ZIO monitor results received via fax, gave to nurse, Allegra Arch.

## 2023-07-29 NOTE — Telephone Encounter (Signed)
 Called the patient. The patient stated that she is asymptomatic. She stated that the PCP did call her with the results and she should follow up with a cardiologist.   She has a new patient with Dr. Nolan Battle on 09/29/23. Results have been placed in his basket for review.

## 2023-08-03 ENCOUNTER — Encounter: Payer: Self-pay | Admitting: Internal Medicine

## 2023-08-03 NOTE — Telephone Encounter (Signed)
 Full zio report received and will have scanned into chart. Patient will need to sign release form when she is seen in the office.

## 2023-08-13 ENCOUNTER — Ambulatory Visit

## 2023-09-29 ENCOUNTER — Ambulatory Visit (INDEPENDENT_AMBULATORY_CARE_PROVIDER_SITE_OTHER)

## 2023-09-29 ENCOUNTER — Ambulatory Visit: Attending: Internal Medicine | Admitting: Internal Medicine

## 2023-09-29 ENCOUNTER — Encounter: Payer: Self-pay | Admitting: Internal Medicine

## 2023-09-29 VITALS — BP 128/64 | HR 66 | Ht 62.0 in | Wt 173.2 lb

## 2023-09-29 DIAGNOSIS — R55 Syncope and collapse: Secondary | ICD-10-CM | POA: Diagnosis not present

## 2023-09-29 DIAGNOSIS — I471 Supraventricular tachycardia, unspecified: Secondary | ICD-10-CM | POA: Diagnosis not present

## 2023-09-29 NOTE — Patient Instructions (Signed)
 Medication Instructions:  Your physician recommends that you continue on your current medications as directed. Please refer to the Current Medication list given to you today.    *If you need a refill on your cardiac medications before your next appointment, please call your pharmacy*  Lab Work: No labs ordered today    Testing/Procedures: Your physician has requested that you have an echocardiogram. Echocardiography is a painless test that uses sound waves to create images of your heart. It provides your doctor with information about the size and shape of your heart and how well your heart's chambers and valves are working.   You may receive an ultrasound enhancing agent through an IV if needed to better visualize your heart during the echo. This procedure takes approximately one hour.  There are no restrictions for this procedure.  This will take place at [address] [facility] [address]) [address], [place] [address]  Please note: We ask at that you not bring children with you during ultrasound (echo/ vascular) testing. Due to room size and safety concerns, children are not allowed in the ultrasound rooms during exams. Our front office staff cannot provide observation of children in our lobby area while testing is being conducted. An adult accompanying a patient to their appointment will only be allowed in the ultrasound room at the discretion of the ultrasound technician under special circumstances. We apologize for any inconvenience.   Your physician has requested that you have a carotid duplex. This test is an ultrasound of the carotid arteries in your neck. It looks at blood flow through these arteries that supply the brain with blood.   Allow one hour for this exam.  There are no restrictions or special instructions.  This will take place at [address] [facility] [address]) [address], [place] [address]  Please note: We ask at that you not bring children with you during  ultrasound (echo/ vascular) testing. Due to room size and safety concerns, children are not allowed in the ultrasound rooms during exams. Our front office staff cannot provide observation of children in our lobby area while testing is being conducted. An adult accompanying a patient to their appointment will only be allowed in the ultrasound room at the discretion of the ultrasound technician under special circumstances. We apologize for any inconvenience.   Your physician has recommended that you wear a 14 day Zio monitor.   This monitor is a medical device that records the heart's electrical activity. Doctors most often use these monitors to diagnose arrhythmias. Arrhythmias are problems with the speed or rhythm of the heartbeat. The monitor is a small device applied to your chest. You can wear one while you do your normal daily activities. While wearing this monitor if you have any symptoms to push the button and record what you felt. Once you have worn this monitor for the period of time provider prescribed (Usually 14 days), you will return the monitor device in the postage paid box. Once it is returned they will download the data collected and provide us  with a report which the provider will then review and we will call you with those results. Important tips:  Avoid showering during the first 24 hours of wearing the monitor. Avoid excessive sweating to help maximize wear time. Do not submerge the device, no hot tubs, and no swimming pools. Keep any lotions or oils away from the patch. After 24 hours you may shower with the patch on. Take brief showers with your back facing  the shower head.  Do not remove patch once it has been placed because that will interrupt data and decrease adhesive wear time. Push the button when you have any symptoms and write down what you were feeling. Once you have completed wearing your monitor, remove and place into box which has postage paid and place in your outgoing  mailbox.  If for some reason you have misplaced your box then call our office and we can provide another box and/or mail it off for you.     Follow-Up: At [facility], you and your health needs are our priority.  As part of our continuing mission to provide you with exceptional heart care, our providers are all part of one team.  This team includes your primary Cardiologist (physician) and Advanced Practice Providers or APPs (Physician Assistants and Nurse Practitioners) who all work together to provide you with the care you need, when you need it.  Your next appointment:   6 week(s)  Provider:   You may see Lonni Hanson, MD or one of the following Advanced Practice Providers on your designated Care Team:   Lonni Meager, NP Lesley Maffucci, PA-C Bernardino Bring, PA-C Cadence Woodbury, PA-C Tylene Lunch, NP Barnie Hila, NP

## 2023-09-29 NOTE — Progress Notes (Signed)
 Cardiology Office Note:  .   Date:  09/29/2023  ID:  Ellen Taylor, DOB 1955-07-01, MRN [id number] PCP: [facility]  [facility] Providers Cardiologist:  None     History of Present Illness: .   Ellen Taylor is a 68 y.o. female with history of hypertension, hyperlipidemia, type 2 diabetes mellitus, and rheumatoid arthritis, who has been referred by Mr. Zachary for evaluation of of palpitations and monitor demonstrating a single brief supraventricular run (see details below).  Today, Ellen Taylor reports that she has been most concerned about dizzy spells that began in May.  She suddenly feels lightheaded, as if she might pass out, with the sensation lasting for up to 10 minutes at a time.  Initially, it happened only every few weeks but has increased in frequency.  She had 6 episodes last month.  The lightheadedness is not positional or related to any particular activity.  She has never passed out completely.  There are no associated symptoms.  She specifically denies palpitations as well as chest pain, shortness of breath, and edema.  She notes that she did not have any episodes while she was wearing the event monitor in May.  ROS: See HPI  Studies Reviewed: SABRA   EKG Interpretation Date/Time:  Wednesday September 29 2023 09:21:38 EDT Ventricular Rate:  66 PR Interval:  192 QRS Duration:  82 QT Interval:  410 QTC Calculation: 429 R Axis:   74  Text Interpretation: Normal sinus rhythm Low voltage QRS Possible Septal infarct versus lead placement Abnormal ECG When compared with ECG of 29-Aug-2013 16:29, No significant change was found Confirmed by Lorrane Mccay ([id number]) on 09/29/2023 9:40:07 AM    14-day event monitor (07/05/2023): Predominantly sinus rhythm with rare PACs and PVCs.  Single supraventricular run lasting 4 beats with maximum rate of 95 bpm noted.  Risk Assessment/Calculations:             Physical Exam:   VS:  BP 128/64   Pulse 66   Ht 5' 2 (1.575  m)   Wt 173 lb 3.2 oz (78.6 kg)   SpO2 98%   BMI 31.68 kg/m    Wt Readings from Last 3 Encounters:  09/29/23 173 lb 3.2 oz (78.6 kg)  12/30/22 172 lb (78 kg)  03/09/19 169 lb (76.7 kg)    General:  NAD. Neck: No JVD or HJR.  No carotid bruit. Lungs: Clear to auscultation bilaterally without wheezes or crackles. Heart: Regular rate and rhythm without murmurs, rubs, or gallops. Abdomen: Soft, nontender, nondistended. Extremities: No lower extremity edema.  ASSESSMENT AND PLAN: .    Dizziness and abnormal EKG: Ellen Taylor reports multiple episodes of presyncope dating back to May, which have increased in frequency.  Her event monitor in May did not show any significant arrhythmias; brief supraventricular run is not expected to have cause such symptoms.  Of note, she did not have any symptomatic episodes while wearing the monitor.  Her physical exam today is unrevealing.  Her EKG shows low voltage and poor R wave progression, similar to 2015 and likely related to lead placement.  However, given her symptoms, I think it would be prudent to obtain an echocardiogram as well as carotid Dopplers.  We will also repeat a 14-day event monitor (ZIO XT) to see if we can capture her heart rhythm during one of these episodes.  We will defer medication changes at this time.  Hypertension: Blood pressure well-controlled.  Continue lisinopril.  Hyperlipidemia associated with type  2 diabetes mellitus: Continue atorvastatin for target LDL less than 100.  Ongoing management of DM per PCP.    Dispo: To clinic in 6 weeks.  Signed, Lonni Hanson, MD

## 2023-10-22 ENCOUNTER — Ambulatory Visit (INDEPENDENT_AMBULATORY_CARE_PROVIDER_SITE_OTHER)

## 2023-10-22 ENCOUNTER — Ambulatory Visit: Attending: Internal Medicine

## 2023-10-22 DIAGNOSIS — R55 Syncope and collapse: Secondary | ICD-10-CM

## 2023-10-22 LAB — ECHOCARDIOGRAM COMPLETE
AR max vel: 2.54 cm2
AV Area VTI: 2.56 cm2
AV Area mean vel: 2.34 cm2
AV Mean grad: 4 mmHg
AV Peak grad: 8.4 mmHg
Ao pk vel: 1.45 m/s
Area-P 1/2: 3.46 cm2
Calc EF: 56 %
MV VTI: 2.41 cm2
S' Lateral: 2.8 cm
Single Plane A2C EF: 56.8 %
Single Plane A4C EF: 57.9 %

## 2023-10-26 ENCOUNTER — Ambulatory Visit: Payer: Self-pay | Admitting: Internal Medicine

## 2023-10-29 DIAGNOSIS — R55 Syncope and collapse: Secondary | ICD-10-CM

## 2023-11-10 NOTE — Progress Notes (Unsigned)
 Cardiology Office Note    Date:  11/11/2023   ID:  Ellen Taylor, DOB 06/30/1955, MRN [id number]  PCP:  Ellen Taylor, [facility]  Cardiologist:  Lonni Hanson, MD  Electrophysiologist:  None   Chief Complaint: Follow up  History of Present Illness:   Ellen Taylor is a 68 y.o. female with history of hypertension, hyperlipidemia, type 2 diabetes, and rheumatoid arthritis who presents for follow up on palpitations.  Patient was initially evaluated by Dr. Hanson 09/29/2023 after referral for evaluation of palpitations with heart monitor demonstrating single brief episode of SVT.  At that time, patient reported concern regarding dizzy spells beginning in May.  She describes these as lightheadedness, feeling as though she may pass out, lasting up to 10 minutes.  These episodes were increasing in frequency, up to 6 episodes in the month prior.  She denied any associated symptoms including palpitations, chest pain, shortness of breath, and edema.  She denied having any symptoms during the period of time she was wearing her event monitor previously.  Echo was ordered and showed EF 60 to 65% with G1 DD and mild MR.  Carotid Dopplers were ordered and revealed mild bilateral stenosis.  Repeat monitor was ordered and showed predominantly sinus rhythm with rare PACs and PVCs and 2 runs of SVT lasting up to 30 seconds with max rate of 167 bpm.  No evidence of sustained arrhythmia or pauses. Patient triggered events were associated with normal sinus rhythm.   Patient presents today overall doing well from a cardiac perspective.  She reports that she did experience some episodes of symptoms previously discussed while she was wearing the heart monitor.  Her triggered events were associated with sinus rhythm. She reports that episodes have overall been improved since prior visit which occurred while she was resting and lasted for about 10 minutes.  Symptoms do not seem to be associated with positional  changes.  She denies chest pain and shortness of breath.  No lower extremity swelling.  She does report a strong family history of heart disease.  Labs independently reviewed: 09/2023-Hgb 15.3, HCT 46.7, platelets 340, normal LFTs, creatinine 0.8  Objective   Past Medical History:  Diagnosis Date   Cancer (HCC)    kidney cancer, 2012. Partial removal of left kidney    Carotid arterial disease (HCC)    a. 09/2023 Carotid U/S: Bilat 1-39% ICA stenoses. Bilat antegrade flow in vertebrals. Nl flow dynamics in bilat subclavian arteries.   Diabetes mellitus without complication (HCC)    Diastolic dysfunction    a. 09/2023 Echo: EF 60-65%, no rwma, GrI DD, nl RV fxn, mildly dil LA, mild MR.   Hyperlipemia    Hypertension    Pre-syncope    a. 09/2023 Zio: triggered events - sinus rhythm.   PSVT (paroxysmal supraventricular tachycardia) (HCC)    a. 09/2023 Zio: predominantly sinus rhythm, avg 69 (49-133). Rare PACs/PVCs. 2 SVT runs up to 29.3 secs, max rate 167. No sustained arrhythmias or pauses. Triggered events - sinus rhythm.   Rheumatoid arthritis (HCC)     Current Medications: Current Meds  Medication Sig   atorvastatin (LIPITOR) 40 MG tablet Take 40 mg by mouth daily.   Cholecalciferol (VITAMIN D) 2000 UNITS CAPS Take 1 capsule by mouth daily.   cyclobenzaprine (FLEXERIL) 5 MG tablet Take 5 mg by mouth 3 (three) times daily as needed for muscle spasms.   diphenhydrAMINE-APAP, sleep, (TYLENOL  PM EXTRA STRENGTH PO) Take by mouth. (Patient taking differently: Take 1 tablet by  mouth at bedtime.)   ezetimibe (ZETIA) 10 MG tablet Take 10 mg by mouth daily.   FLUoxetine (PROZAC) 20 MG capsule Take 20 mg by mouth daily.   folic acid (FOLVITE) 1 MG tablet Take 1 mg by mouth daily.   gabapentin (NEURONTIN) 300 MG capsule Take 300 mg by mouth. (Patient taking differently: Take 300 mg by mouth 3 (three) times daily as needed (neuropathy).)   HYDROcodone-acetaminophen  (NORCO) 10-325 MG per tablet  Take 1 tablet by mouth every 6 (six) hours as needed for moderate pain.  (Patient taking differently: Take 1 tablet by mouth every 6 (six) hours as needed for moderate pain (pain score 4-6). 10-500 prn)   hydroxychloroquine (PLAQUENIL) 200 MG tablet Take 200 mg by mouth. (Patient taking differently: Take 200 mg by mouth daily.)   Insulin Degludec (TRESIBA ) Inject into the skin. (Patient taking differently: Inject 27 Units into the skin daily.)   INVOKANA 100 MG TABS tablet Take 100 mg by mouth every morning.   Melatonin 5 MG CAPS Take by mouth. (Patient taking differently: Take 10 mg by mouth at bedtime.)   methotrexate (RHEUMATREX) 2.5 MG tablet Take 20 mg by mouth once a week. Caution:Chemotherapy. Protect from light. (Patient taking differently: Take 8 tablets by mouth once a week. Caution:Chemotherapy. Protect from light.)   multivitamin-lutein (OCUVITE-LUTEIN) CAPS capsule Take 1 capsule by mouth daily.   nystatin -triamcinolone  (MYCOLOG II) cream Apply 1 application topically 2 (two) times daily. (Patient taking differently: Apply 1 application  topically as needed (rash).)   OZEMPIC, 0.25 OR 0.5 MG/DOSE, 2 MG/3ML SOPN Inject 0.5 mg into the skin once a week.   sulfaSALAzine (AZULFIDINE) 500 MG tablet Take 500 mg by mouth daily. (Patient taking differently: Take 1,000 mg by mouth 2 (two) times daily.)    Allergies:   Metformin and related   Social History   Socioeconomic History   Marital status: Married    Spouse name: Not on file   Number of children: Not on file   Years of education: Not on file   Highest education level: Not on file  Occupational History   Not on file  Tobacco Use   Smoking status: Every Day    Current packs/day: 0.00    Average packs/day: 0.5 packs/day for 35.0 years (17.5 ttl pk-yrs)    Types: Cigarettes    Start date: 02/23/1978    Last attempt to quit: 02/23/2013    Years since quitting: 10.7   Smokeless tobacco: Never  Substance and Sexual Activity    Alcohol use: No   Drug use: No   Sexual activity: Not Currently    Birth control/protection: Post-menopausal  Other Topics Concern   Not on file  Social History Narrative   Not on file   Social Drivers of Health   Financial Resource Strain: Not on file  Food Insecurity: Not on file  Transportation Needs: Not on file  Physical Activity: Not on file  Stress: Not on file  Social Connections: Not on file     Family History:  The patient's family history includes Breast cancer in her maternal aunt and sister; Diabetes in her brother, maternal aunt, and mother; Heart disease in her brother, brother, and brother; Heart disease (age of onset: 47) in her father; Stroke (age of onset: 69) in her mother. There is no history of Rashes / Skin problems.  ROS:   12-point review of systems is negative unless otherwise noted in the HPI.  EKGs/Other Studies Reviewed:  Studies reviewed were summarized above. The additional studies were reviewed today:  10/2023 Long term monitor   The patient was monitored for 14 days.   The predominant rhythm was sinus with an average rate of 69 bpm (range 49-133 bpm in sinus).   There were rare PACs and PVCs.   2 supraventricular runs lasting up to 29.3 seconds occurred with a maximum rate of 167 bpm.   There was no sustained arrhythmia or prolonged pause.   Patient triggered events correspond to normal sinus rhythm.  09/2023 Carotid US  Right Carotid: Velocities in the right ICA are consistent with a 1-39% stenosis. Mild smooth, homogenous plaque noted in the carotid bulb.  Left Carotid: Velocities in the left ICA are consistent with a 1-39% stenosis.Mild smooth, homogenous plaque noted in the carotid bulb.  Vertebrals:  Bilateral vertebral arteries demonstrate antegrade flow.  Subclavians: Normal flow hemodynamics were seen in bilateral subclavian arteries.   09/2023 Echo complete 1. Left ventricular ejection fraction, by estimation, is 60 to 65%. The left  ventricle has normal function. The left ventricle has no regional wall motion abnormalities. Left ventricular diastolic parameters are consistent with Grade I diastolic dysfunction (impaired relaxation).   2. Right ventricular systolic function is normal. The right ventricular size is normal. Tricuspid regurgitation signal is inadequate for assessing PA pressure.   3. Left atrial size was mildly dilated.   4. The mitral valve is normal in structure. Mild mitral valve  regurgitation. No evidence of mitral stenosis.   5. The aortic valve is normal in structure. Aortic valve regurgitation is not visualized. No aortic stenosis is present.   6. The inferior vena cava is normal in size with greater than 50%  respiratory variability, suggesting right atrial pressure of 3 mmHg.   EKG:  EKG personally reviewed by me today EKG Interpretation Date/Time:  Thursday November 11 2023 10:19:10 EDT Ventricular Rate:  62 PR Interval:  194 QRS Duration:  80 QT Interval:  398 QTC Calculation: 403 R Axis:   65  Text Interpretation: Normal sinus rhythm Low voltage QRS Septal infarct versus lead placement (cited on or before 29-Sep-2023) Confirmed by Lorene Sinclair ([id number]) on 11/11/2023 10:21:32 AM  PHYSICAL EXAM:    VS:  BP 110/70 (BP Location: Left Arm, Patient Position: Sitting, Cuff Size: Large)   Pulse 62   Ht 5' 2 (1.575 m)   Wt 172 lb 6 oz (78.2 kg)   SpO2 98%   BMI 31.53 kg/m   BMI: Body mass index is 31.53 kg/m.  Orthostatic VS for the past 24 hrs:  BP- Lying Pulse- Lying BP- Sitting Pulse- Sitting BP- Standing at 0 minutes Pulse- Standing at 0 minutes  11/11/23 1041 126/75 59 125/77 68 126/73 68    GEN: Well nourished, well developed in no acute distress NECK: No JVD; No carotid bruits CARDIAC: RRR, no murmurs, rubs, gallops RESPIRATORY:  Clear to auscultation without rales, wheezing or rhonchi  ABDOMEN: Soft, non-tender, non-distended EXTREMITIES: No edema; No deformity  Wt Readings  from Last 3 Encounters:  11/11/23 172 lb 6 oz (78.2 kg)  09/29/23 173 lb 3.2 oz (78.6 kg)  12/30/22 172 lb (78 kg)       ASSESSMENT & PLAN:   Dizziness Abnormal EKG - Patient previously evaluated for intermittent episodes of lightheadedness/dizziness occurring both at rest and with exertion.  Echo showed normal LV systolic function with G1 DD and mild MR.  Heart monitor showed predominantly sinus rhythm with 2 short episodes of SVT during which  patient was asymptomatic.  Patient triggered events corresponded with normal sinus rhythm.  Carotid Dopplers consistent with mild stenosis, not likely contributing to symptoms.  Episodes have become less frequent since prior visit. Orthostatic vital signs are negative today. Provided reassurance regarding workup and encouraged oral hydration.   Hypertension - BP well controlled on current regimen  Hyperlipidemia - Most recent lipid panel 08/2020 with LDL 53. She is continued on atorvastatin and ezetimibe.    Disposition: F/u with Dr. Mady or an APP in 1 year.   Medication Adjustments/Labs and Tests Ordered: Current medicines are reviewed at length with the patient today.  Concerns regarding medicines are outlined above. Medication changes, Labs and Tests ordered today are summarized above and listed in the Patient Instructions accessible in Encounters.   Bonney Lesley Maffucci, PA-C 11/11/2023 10:23 AM     [facility] [place] [address] [place], [place] [address] [phone]

## 2023-11-11 ENCOUNTER — Ambulatory Visit: Attending: Nurse Practitioner | Admitting: Physician Assistant

## 2023-11-11 ENCOUNTER — Encounter: Payer: Self-pay | Admitting: Nurse Practitioner

## 2023-11-11 VITALS — BP 110/70 | HR 62 | Ht 62.0 in | Wt 172.4 lb

## 2023-11-11 DIAGNOSIS — I471 Supraventricular tachycardia, unspecified: Secondary | ICD-10-CM | POA: Diagnosis not present

## 2023-11-11 DIAGNOSIS — I10 Essential (primary) hypertension: Secondary | ICD-10-CM | POA: Diagnosis not present

## 2023-11-11 DIAGNOSIS — R55 Syncope and collapse: Secondary | ICD-10-CM | POA: Diagnosis not present

## 2023-11-11 DIAGNOSIS — E782 Mixed hyperlipidemia: Secondary | ICD-10-CM

## 2023-11-11 NOTE — Patient Instructions (Signed)
 Medication Instructions:  No changes *If you need a refill on your cardiac medications before your next appointment, please call your pharmacy*  Lab Work: None ordered If you have labs (blood work) drawn today and your tests are completely normal, you will receive your results only by: MyChart Message (if you have MyChart) OR A paper copy in the mail If you have any lab test that is abnormal or we need to change your treatment, we will call you to review the results.  Testing/Procedures: None ordered  Follow-Up: At [facility], you and your health needs are our priority.  As part of our continuing mission to provide you with exceptional heart care, our providers are all part of one team.  This team includes your primary Cardiologist (physician) and Advanced Practice Providers or APPs (Physician Assistants and Nurse Practitioners) who all work together to provide you with the care you need, when you need it.  Your next appointment:   12 month(s)  Provider:   You may see Lonni Hanson, MD or one of the following Advanced Practice Providers on your designated Care Team:   Lesley Maffucci, PA-C   We recommend signing up for the patient portal called MyChart.  Sign up information is provided on this After Visit Summary.  MyChart is used to connect with patients for Virtual Visits (Telemedicine).  Patients are able to view lab/test results, encounter notes, upcoming appointments, etc.  Non-urgent messages can be sent to your provider as well.   To learn more about what you can do with MyChart, go to [email or web address].

## 2024-01-19 NOTE — Progress Notes (Signed)
 Chief Complaint  Patient presents with   Rheumatoid Arthritis    History of present illness:    Ellen Taylor is a 68 y.o.female who presents today for follow-up evaluation of active moderate Stage 2 rheumatoid arthritis with positive rheumatoid factor. She was last seen 10/21/2023 and was doing mostly well. We noted mild breakthrough disease and increased sulfasalazine to full dosing, continuing methotrexate and plaquenil  unchanged.  She last got hand x-rays completed 07/2021.    Ellen Taylor was diagnosed with rheumatoid arthritis with positive rheumatoid factor in 2000 by Dr. G.W. Kernodle.    Ellen Taylor has tried the following rheumatologic medications in the past: - Prednisone - was on for ten years - Methotrexate - current - Plaquenil  - current - Gabapentin  - current - Sulfasalazine - current  She is doing better since the sulfasalazine adjustment last visit, noting within about two weeks her symptoms improved and have not flared since then. She denies significant morning stiffness.    Ellen Taylor is currently being treated with methotrexate and plaquenil  and sulfasalazine.  She has not experienced side effects from the medication.  She has had any infections since last office visit.    She has not had any other significant health changes or unexplained new symptoms since last visit.   Ellen Taylor's work status is retired. She is a current smoker who smokes 0.25 ppd for 30 years, for a total of 7.5 pack years. She drinks an average of 0 servings of alcohol weekly. Maicy reports a family history of RA in her great great uncles but denies any family history of known autoimmune disease otherwise.    Review of Systems ROS was negative except as noted above.  Patient Active Problem List   Diagnosis Date Noted   Sensory disturbance 12/23/2016   Spondylolisthesis of lumbar region 05/15/2015   DDD (degenerative disc disease), lumbar 10/24/2014   Lumbar radiculitis  10/24/2014   Cervical radiculitis 08/07/2014   Lumbar stenosis with neurogenic claudication 08/07/2014   Rheumatoid arthritis with rheumatoid factor (GWK) 11/07/2013   Osteoarthritis (GWK) 11/07/2013   Depression    Diabetes mellitus type 2, uncomplicated (CMS/HHS-HCC)    Hypertension    GERD (gastroesophageal reflux disease)    Carpal tunnel syndrome    Renal carcinoma (CMS/HHS-HCC)     Past Medical History:  Diagnosis Date   Carpal tunnel syndrome    Depression    Diabetes mellitus type 2, uncomplicated (CMS/HHS-HCC)    GERD (gastroesophageal reflux disease)    Hyperlipidemia    Hypertension    Osteoarthritis (GWK) 11/07/2013   a.  Knees.   b.  Lumbar spine.     Renal carcinoma (CMS/HHS-HCC)    cancer   Rheumatoid arthritis with rheumatoid factor (GWK) 11/07/2013   a.  10 years of Prednisone without changes in hand x-rays.   b.  Methotrexate.   c.  Positive rheumatoid factor, negative anti-CCP antibodies.     Past Surgical History:  Procedure Laterality Date   TRANSFORAMINAL LUMBAR INTERBODY FUSION MINIMALLY INVASIVE N/A 03/25/2016   Procedure: MINIMALLY INVASIVE ARTHRODESIS, COMBINED POSTERIOR/POSTEROLATERAL TECHNIQUE WITH POSTERIOR INTERBODY TECHNIQUE INCL LAMINECTOMY/DISCECTOMY TO PREPARE INTERSPACE, SINGLE INTERSPACE AND SEGMENT; LUMBAR--L4-5 TLIF MIS;  Surgeon: Reeves Andy Daisy, MD;  Location: [facility] OR;  Service: Neurosurgery;  Laterality: N/A;   INSTRUMENTATION POSTERIOR SPINE 1/2 VERTEBRAL SEGMENTS W/O FIXATION N/A 03/25/2016   Procedure: POSTERIOR NONSEGMENTAL INSTRUMENT, TECHNI, PEDICLE FIX 1 INTERSPACE, ATANTOAXIAL TRANSARTIC SCREW FIX, SUBLAMINAR WIRING C1, FACET SCREW FIX) (LIST IN ADDITION CODE FOR PRIMARY  PROCEDURE);  Surgeon: Reeves Andy Daisy, MD;  Location: [facility] OR;  Service: Neurosurgery;  Laterality: N/A;   INSERTION MORSELIZED BONE ALLOGRAFT FOR SPINE SURGERY N/A 03/25/2016   Procedure: ALLOGRAFT, MORSELIZED, OR PLACEMENT OF  OSTEOPROMOTIVE MATERIAL, FOR SPINE SURGERY ONLY (LIST IN ADDITION TO PRIMARY PROCEDURE);  Surgeon: Reeves Andy Daisy, MD;  Location: [facility] OR;  Service: Neurosurgery;  Laterality: N/A;   AUTOGRAFT OBTAINED SAME INCISION FOR SPINE SURGERY N/A 03/25/2016   Procedure: AUTOGRAFT FOR SPINE SURGERY ONLY (INCL HARVESTING GRAFT); LOCAL (EG, RIBS, SPINOUS PROCESS, OR LAMINAR FRAGMENT) OBTAINED FROM SAME INCISION (LIST IN ADDITION TO PRIMARY PROCEDURE);  Surgeon: Reeves Andy Daisy, MD;  Location: [facility] OR;  Service: Neurosurgery;  Laterality: N/A;   CESAREAN SECTION     CHOLECYSTECTOMY     Partial left nephrectomy     Social History   Socioeconomic History   Marital status: Married  Tobacco Use   Smoking status: Former    Current packs/day: 0.00    Types: Cigarettes    Quit date: 09/24/2014    Years since quitting: 9.3   Smokeless tobacco: Never  Substance and Sexual Activity   Alcohol use: No   Drug use: No   Family History  Problem Relation Name Age of Onset   Depression Mother     High blood pressure (Hypertension) Mother     Hyperlipidemia (Elevated cholesterol) Mother     Stroke Mother     High blood pressure (Hypertension) Father     Hyperlipidemia (Elevated cholesterol) Father     Ulcers Father      Physical Exam: BP 115/63   Pulse 69   Temp 36.3 C (97.4 F)   Ht 157.5 cm (5' 2)   Wt 77.1 kg (170 lb)   BMI 31.09 kg/m   General: Pleasant white woman sitting in chair. Well groomed, no acute distress. Non-toxic appearance.  HEENT: Conjunctivae normal.  Pulmonary: Normal effort of breathing. Symmetric chest expansion.  Musculoskeletal: Nontender to palpation throughout appendicular joint exam. No appreciable synovitis. Fist formation unimpaired bilaterally. Grip strength appropriate and equal. Negative squeeze tests to hands and feet. No other tender, synovitic, warm, erythematous, or deformed joints. FROM all joints except as above.  Skin: Skin warm  and dry. No rashes appreciable. Neurologic: Oriented to time, person, place, and situation.  Psychological: Normal behavior, thought content, and judgment. Records were reviewed No results found for: RF, CCP Lab Results  Component Value Date/Time   SEDRATE 5 07/21/2023 11:41 AM   Lab Results  Component Value Date/Time   GLUCOSE 152 (H) 03/20/2016 12:31 PM   BUN 15 03/20/2016 12:31 PM   CREATININE 0.8 10/21/2023 11:43 AM   NA 135 (L) 03/20/2016 12:31 PM   K 4.4 03/20/2016 12:31 PM   CL 98 03/20/2016 12:31 PM   CO2 27.6 03/20/2016 12:31 PM   CALCIUM  10.2 03/20/2016 12:31 PM   ALB 4.5 10/21/2023 11:43 AM   ALKPHOS 83 10/21/2023 11:43 AM   AST 16 10/21/2023 11:43 AM   ALT 14 10/21/2023 11:43 AM   Lab Results  Component Value Date/Time   WBC 8.1 10/21/2023 11:43 AM   RBC 5.09 10/21/2023 11:43 AM   HGB 15.3 (H) 10/21/2023 11:43 AM   HCT 46.7 10/21/2023 11:43 AM   MCV 91.7 10/21/2023 11:43 AM   MCH 30.1 10/21/2023 11:43 AM   MCHC 32.8 10/21/2023 11:43 AM   PLT 340 10/21/2023 11:43 AM    Failed to redirect to the Timeline version of the REVFS SmartLink. CDAI is calculated  as follows:  SDAI = SJC + TJC +PGA + EGA Where: 0 + 0 + 0 + 0 = 0  SJC = Swollen Joint Count TJC = Tender Joint Count PGA = Patient Global Assessment of Disease Activity EGA = Evaluator Global Assessment of Disease Activity  Interpretation <=2.8: Remission >2.8 and <=10: Low Disease Activity >10 and <=22: Moderate Disease Activity >22: High Disease Activity  Assessment and Plan: Rheumatoid arthritis involving multiple sites with positive rheumatoid factor (CMS/HHS-HCC)  (primary encounter diagnosis) Plan: CBC w/auto Differential (5 Part), Hepatic        Function Panel (HFP), Creatinine, C-Reactive        Protein, Quant - [facility], Sedimentation        Rate-Westergren - [facility]  Encounter for long-term (current) use of high-risk medication  1. Rheumatoid arthritis-moderate Stage 2.  Well  controlled and Improved.  Continue triple therapy.   2. High risk prescription-This patient is on drug therapy requiring intensive monitoring for toxicity, including regular lab monitoring and screening for serious or recurrent infections.  Today, I assessed for side effects, infections, new or worsening health conditions that may be related to the medications, and will obtain labs.  Return in about 4 months (around 05/26/2024).     Medication List      * Accurate as of January 26, 2024  2:32 PM. If you have any questions, ask your nurse or doctor.        CONTINUE taking these medications   ACCU-CHEK GUIDE TEST STRIPS test strip Generic drug: blood glucose diagnostic   aspirin  81 MG EC tablet Take 1 tablet (81 mg total) by mouth once daily.   atorvastatin  40 MG tablet Commonly known as: LIPITOR   b complex vitamins tablet   cholecalciferol 400 unit tablet Commonly known as: VITAMIN D3   cyclobenzaprine  5 MG tablet Commonly known as: FLEXERIL  TAKE 1 TABLET BY MOUTH THREE TIMES A DAY AS NEEDED FOR MUSCLE SPASM   ezetimibe  10 mg tablet Commonly known as: ZETIA    FLUoxetine  40 MG capsule Commonly known as: PROzac    fluticasone propionate 50 mcg/actuation nasal spray Commonly known as: FLONASE   folic acid  1 MG tablet Commonly known as: FOLVITE  Take 1 tablet (1 mg total) by mouth once daily for 360 days   gabapentin  300 MG capsule Commonly known as: NEURONTIN    glimepiride 4 MG tablet Commonly known as: AMARYL   HYDROcodone -acetaminophen  10-325 mg tablet Commonly known as: NORCO   hydroxychloroquine  200 mg tablet Commonly known as: PLAQUENIL  Take 1 tablet (200 mg total) by mouth once daily   INVOKANA 100 mg tablet Generic drug: canagliflozin   linaCLOtide 145 mcg capsule Commonly known as: LINZESS   MELATONIN ORAL   meloxicam 7.5 MG tablet Commonly known as: MOBIC TAKE 1 TABLET BY MOUTH EVERY DAY   methotrexate 2.5 MG tablet Commonly known as:  RHEUMATREX TAKE 8 TABLETS (20 MG TOTAL) BY MOUTH EVERY 7 (SEVEN) DAYS FOR 90 DAYS   OZEMPIC 0.25 mg or 0.5 mg (2 mg/3 mL) pen injector Generic drug: semaglutide   pravastatin 80 MG tablet Commonly known as: PRAVACHOL   sulfaSALAzine 500 mg tablet Commonly known as: AZULFIDINE TAKE 2 TABLETS (1,000 MG TOTAL) BY MOUTH 2 (TWO) TIMES DAILY FOR 90 DAYS   TRESIBA FLEXTOUCH U-100 pen injector (concentration 100 units/mL) Generic drug: insulin  DEGLUDEC      Orders Placed This Encounter  Procedures   CBC w/auto Differential (5 Part)   Hepatic Function Panel (HFP)   Creatinine  C-Reactive Protein, Quant - [facility]   Sedimentation Rate-Westergren - [facility]    All new prescription medications, changes in current prescription dosages, and sample medications were discussed with the patient, including patient education, medication name, use, dosage, potential side effects, drug interactions, consequences of not using/taking, and special instructions. Patient expressed understanding. No barriers to adherence. *Some images could not be shown.

## 2024-02-09 ENCOUNTER — Observation Stay

## 2024-02-09 ENCOUNTER — Observation Stay
Admission: EM | Admit: 2024-02-09 | Discharge: 2024-02-10 | Disposition: A | Discharge status: Home / Self Care | Source: Home / Self Care | Attending: Emergency Medicine | Admitting: Emergency Medicine

## 2024-02-09 ENCOUNTER — Emergency Department

## 2024-02-09 ENCOUNTER — Other Ambulatory Visit: Payer: Self-pay

## 2024-02-09 DIAGNOSIS — F32A Depression, unspecified: Secondary | ICD-10-CM | POA: Diagnosis not present

## 2024-02-09 DIAGNOSIS — Z794 Long term (current) use of insulin: Secondary | ICD-10-CM | POA: Insufficient documentation

## 2024-02-09 DIAGNOSIS — Z6831 Body mass index (BMI) 31.0-31.9, adult: Secondary | ICD-10-CM | POA: Insufficient documentation

## 2024-02-09 DIAGNOSIS — Z85528 Personal history of other malignant neoplasm of kidney: Secondary | ICD-10-CM | POA: Insufficient documentation

## 2024-02-09 DIAGNOSIS — R29701 NIHSS score 1: Secondary | ICD-10-CM

## 2024-02-09 DIAGNOSIS — E785 Hyperlipidemia, unspecified: Secondary | ICD-10-CM | POA: Diagnosis not present

## 2024-02-09 DIAGNOSIS — R200 Anesthesia of skin: Secondary | ICD-10-CM | POA: Diagnosis present

## 2024-02-09 DIAGNOSIS — M069 Rheumatoid arthritis, unspecified: Secondary | ICD-10-CM | POA: Diagnosis not present

## 2024-02-09 DIAGNOSIS — F17210 Nicotine dependence, cigarettes, uncomplicated: Secondary | ICD-10-CM | POA: Diagnosis not present

## 2024-02-09 DIAGNOSIS — G629 Polyneuropathy, unspecified: Secondary | ICD-10-CM | POA: Insufficient documentation

## 2024-02-09 DIAGNOSIS — Z79899 Other long term (current) drug therapy: Secondary | ICD-10-CM | POA: Diagnosis not present

## 2024-02-09 DIAGNOSIS — Z8679 Personal history of other diseases of the circulatory system: Secondary | ICD-10-CM | POA: Diagnosis not present

## 2024-02-09 DIAGNOSIS — I10 Essential (primary) hypertension: Secondary | ICD-10-CM | POA: Diagnosis not present

## 2024-02-09 DIAGNOSIS — E669 Obesity, unspecified: Secondary | ICD-10-CM | POA: Insufficient documentation

## 2024-02-09 DIAGNOSIS — Z7982 Long term (current) use of aspirin: Secondary | ICD-10-CM | POA: Diagnosis not present

## 2024-02-09 DIAGNOSIS — G459 Transient cerebral ischemic attack, unspecified: Principal | ICD-10-CM | POA: Diagnosis present

## 2024-02-09 DIAGNOSIS — E119 Type 2 diabetes mellitus without complications: Secondary | ICD-10-CM | POA: Diagnosis not present

## 2024-02-09 DIAGNOSIS — I6340 Cerebral infarction due to embolism of unspecified cerebral artery: Secondary | ICD-10-CM

## 2024-02-09 DIAGNOSIS — E1169 Type 2 diabetes mellitus with other specified complication: Secondary | ICD-10-CM

## 2024-02-09 LAB — CBC
HCT: 48.2 % — ABNORMAL HIGH (ref 36.0–46.0)
Hemoglobin: 15.5 g/dL — ABNORMAL HIGH (ref 12.0–15.0)
MCH: 28.7 pg (ref 26.0–34.0)
MCHC: 32.2 g/dL (ref 30.0–36.0)
MCV: 89.1 fL (ref 80.0–100.0)
Platelets: 308 K/uL (ref 150–400)
RBC: 5.41 MIL/uL — ABNORMAL HIGH (ref 3.87–5.11)
RDW: 13.6 % (ref 11.5–15.5)
WBC: 6.5 K/uL (ref 4.0–10.5)
nRBC: 0 % (ref 0.0–0.2)

## 2024-02-09 LAB — COMPREHENSIVE METABOLIC PANEL WITH GFR
ALT: 12 U/L (ref 0–44)
AST: 17 U/L (ref 15–41)
Albumin: 4.3 g/dL (ref 3.5–5.0)
Alkaline Phosphatase: 88 U/L (ref 38–126)
Anion gap: 10 (ref 5–15)
BUN: 19 mg/dL (ref 8–23)
CO2: 26 mmol/L (ref 22–32)
Calcium: 9.9 mg/dL (ref 8.9–10.3)
Chloride: 101 mmol/L (ref 98–111)
Creatinine, Ser: 0.76 mg/dL (ref 0.44–1.00)
GFR, Estimated: >60 mL/min (ref >60)
Glucose, Bld: 144 mg/dL — ABNORMAL HIGH (ref 70–99)
Potassium: 4 mmol/L (ref 3.5–5.1)
Sodium: 138 mmol/L (ref 135–145)
Total Bilirubin: 0.4 mg/dL (ref 0.0–1.2)
Total Protein: 7.5 g/dL (ref 6.5–8.1)

## 2024-02-09 LAB — GLUCOSE, CAPILLARY
Glucose-Capillary: 133 mg/dL — ABNORMAL HIGH (ref 70–99)
Glucose-Capillary: 159 mg/dL — ABNORMAL HIGH (ref 70–99)

## 2024-02-09 LAB — TROPONIN T, HIGH SENSITIVITY: Troponin T High Sensitivity: <15 ng/L (ref 0–19)

## 2024-02-09 MED ORDER — ASPIRIN 325 MG PO TABS
325.0000 mg | ORAL_TABLET | Freq: Once | ORAL | Status: AC
  Administered 2024-02-09: 22:00:00 325 mg via ORAL
  Filled 2024-02-09: qty 1

## 2024-02-09 MED ORDER — ACETAMINOPHEN 650 MG RE SUPP: 650.0000 mg | Freq: Four times a day (QID) | RECTAL | Status: DC | PRN

## 2024-02-09 MED ORDER — FOLIC ACID 1 MG PO TABS
1.0000 mg | ORAL_TABLET | Freq: Every day | ORAL | Status: DC
  Administered 2024-02-09 – 2024-02-10 (×2): 1 mg via ORAL
  Filled 2024-02-09 (×2): qty 1

## 2024-02-09 MED ORDER — CYCLOBENZAPRINE HCL 10 MG PO TABS
5.0000 mg | ORAL_TABLET | Freq: Three times a day (TID) | ORAL | Status: DC | PRN
  Filled 2024-02-09: qty 0.5

## 2024-02-09 MED ORDER — MORPHINE SULFATE (PF) 2 MG/ML IV SOLN: 1.0000 mg | INTRAVENOUS | Status: DC | PRN

## 2024-02-09 MED ORDER — EZETIMIBE 10 MG PO TABS
10.0000 mg | ORAL_TABLET | Freq: Every day | ORAL | Status: DC
  Administered 2024-02-09 – 2024-02-10 (×2): 10 mg via ORAL
  Filled 2024-02-09 (×2): qty 1

## 2024-02-09 MED ORDER — STROKE: EARLY STAGES OF RECOVERY BOOK: Freq: Once | Status: AC

## 2024-02-09 MED ORDER — HYDROCODONE-ACETAMINOPHEN 5-325 MG PO TABS: 1.0000 | ORAL_TABLET | ORAL | Status: DC | PRN

## 2024-02-09 MED ORDER — INSULIN ASPART 100 UNIT/ML IJ SOLN
0.0000 [IU] | Freq: Three times a day (TID) | INTRAMUSCULAR | Status: DC
  Administered 2024-02-10: 09:00:00 2 [IU] via SUBCUTANEOUS
  Filled 2024-02-09: qty 2

## 2024-02-09 MED ORDER — HYDROXYCHLOROQUINE SULFATE 200 MG PO TABS
200.0000 mg | ORAL_TABLET | Freq: Every day | ORAL | Status: DC
  Administered 2024-02-09 – 2024-02-10 (×2): 200 mg via ORAL
  Filled 2024-02-09 (×2): qty 1

## 2024-02-09 MED ORDER — FLUOXETINE HCL 20 MG PO CAPS
20.0000 mg | ORAL_CAPSULE | Freq: Every day | ORAL | Status: DC
  Administered 2024-02-09 – 2024-02-10 (×2): 20 mg via ORAL
  Filled 2024-02-09 (×2): qty 1

## 2024-02-09 MED ORDER — ENOXAPARIN SODIUM 40 MG/0.4ML IJ SOSY: 40.0000 mg | PREFILLED_SYRINGE | INTRAMUSCULAR | Status: DC

## 2024-02-09 MED ORDER — ASPIRIN 81 MG PO TBEC
81.0000 mg | DELAYED_RELEASE_TABLET | Freq: Every day | ORAL | Status: DC
  Administered 2024-02-10: 09:00:00 81 mg via ORAL
  Filled 2024-02-09: qty 1

## 2024-02-09 MED ORDER — GABAPENTIN 300 MG PO CAPS
300.0000 mg | ORAL_CAPSULE | Freq: Three times a day (TID) | ORAL | Status: DC | PRN
  Administered 2024-02-09: 22:00:00 300 mg via ORAL
  Filled 2024-02-09: qty 1

## 2024-02-09 MED ORDER — SENNOSIDES-DOCUSATE SODIUM 8.6-50 MG PO TABS
1.0000 | ORAL_TABLET | Freq: Every evening | ORAL | Status: DC | PRN
  Administered 2024-02-09: 22:00:00 1 via ORAL
  Filled 2024-02-09: qty 1

## 2024-02-09 MED ORDER — ACETAMINOPHEN 325 MG PO TABS
650.0000 mg | ORAL_TABLET | Freq: Four times a day (QID) | ORAL | Status: DC | PRN
  Administered 2024-02-09: 19:00:00 650 mg via ORAL
  Filled 2024-02-09: qty 2

## 2024-02-09 MED ORDER — ONDANSETRON HCL 4 MG/2ML IJ SOLN: 4.0000 mg | Freq: Four times a day (QID) | INTRAMUSCULAR | Status: DC | PRN

## 2024-02-09 MED ORDER — ATORVASTATIN CALCIUM 20 MG PO TABS
40.0000 mg | ORAL_TABLET | Freq: Every day | ORAL | Status: DC
  Administered 2024-02-09 – 2024-02-10 (×2): 40 mg via ORAL
  Filled 2024-02-09 (×2): qty 2

## 2024-02-09 MED ORDER — SODIUM CHLORIDE 0.9% FLUSH
3.0000 mL | Freq: Two times a day (BID) | INTRAVENOUS | Status: DC
  Administered 2024-02-09 – 2024-02-10 (×3): 3 mL via INTRAVENOUS

## 2024-02-09 MED ORDER — ONDANSETRON HCL 4 MG PO TABS: 4.0000 mg | ORAL_TABLET | Freq: Four times a day (QID) | ORAL | Status: DC | PRN

## 2024-02-09 MED ORDER — CLOPIDOGREL BISULFATE 75 MG PO TABS
75.0000 mg | ORAL_TABLET | Freq: Every day | ORAL | Status: DC
  Administered 2024-02-09 – 2024-02-10 (×2): 75 mg via ORAL
  Filled 2024-02-09 (×2): qty 1

## 2024-02-09 MED ORDER — IOHEXOL 350 MG/ML SOLN
75.0000 mL | Freq: Once | INTRAVENOUS | Status: AC | PRN
  Administered 2024-02-09: 20:00:00 75 mL via INTRAVENOUS

## 2024-02-09 NOTE — ED Notes (Signed)
 Patient transported to CT

## 2024-02-09 NOTE — ED Notes (Signed)
 Pt complains of right sided facial tingling and headache pain 3/5 at this time.

## 2024-02-09 NOTE — ED Provider Notes (Signed)
 [facility] Provider Note    Event Date/Time   First MD Initiated Contact with Patient 02/09/24 [id number]     (approximate)  History   Chief Complaint: Numbness  HPI  Ellen Taylor is a 68 y.o. female with a past medical history of diabetes, hypertension, hyperlipidemia, presents to the emergency department for right sided symptoms.  According to the patient she got up around 730 this morning and felt normal.  She states she went to the living room turned on the TV and approximately 10 to 15 minutes later began experiencing a tingling and numbness sensation in her right face right lips and right arm.  Describes a sensation as tingling and feeling very heavy.  She attempted to call for her son but states her words were not coming out properly and sounded garbled.  Patient denies any history of CVA previously.  No headache.  After approximately 30 to 45 minutes symptoms resolved.  Patient arrives to the emergency department symptom-free.  Physical Exam   Triage Vital Signs: ED Triage Vitals  Encounter Vitals Group     BP 02/09/24 0904 (!) 175/71     Girls Systolic BP Percentile --      Girls Diastolic BP Percentile --      Boys Systolic BP Percentile --      Boys Diastolic BP Percentile --      Pulse Rate 02/09/24 0904 60     Resp 02/09/24 0904 14     Temp 02/09/24 0904 97.7 F (36.5 C)     Temp Source 02/09/24 0904 Oral     SpO2 02/09/24 0904 98 %     Weight --      Height --      Head Circumference --      Peak Flow --      Pain Score 02/09/24 0857 0     Pain Loc --      Pain Education --      Exclude from Growth Chart --     Most recent vital signs: Vitals:   02/09/24 0904  BP: (!) 175/71  Pulse: 60  Resp: 14  Temp: 97.7 F (36.5 C)  SpO2: 98%    General: Awake, no distress.  CV:  Good peripheral perfusion.  Regular rate and rhythm  Resp:  Normal effort.  Equal breath sounds bilaterally.  Abd:  No distention.  Soft, nontender.  No rebound  or guarding. Other:  Equal grip strength bilaterally with no pronator drift.  5/5 motor in upper and lower extremities.  No cranial nerve deficits.   ED Results / Procedures / Treatments   EKG  EKG viewed and interpreted by myself shows what appears to be a sinus rhythm at 57 bpm with a narrow QRS, normal axis, normal intervals, no concerning ST changes.  RADIOLOGY  I have reviewed interpreted the CT head images.  No bleed seen on my evaluation. CT scan read as negative by radiology.   MEDICATIONS ORDERED IN ED: Medications - No data to display   IMPRESSION / MDM / ASSESSMENT AND PLAN / ED COURSE  I reviewed the triage vital signs and the nursing notes.  Patient's presentation is most consistent with acute presentation with potential threat to life or bodily function.  Patient presents emergency department for right-sided deficits of the face and arm that occurred around 745 this morning and resolved by 830 or so per patient.  Patient is symptom-free now.  However her symptoms seem very suggestive of  TIA/mini stroke.  Will check labs to obtain a CT scan of the head, EKG.  Will continue to closely monitor.  Given the patient's concerning presentation this morning I suspect the patient will require admission for TIA workup.  Patient's workup today shows a reassuring CBC, reassuring chemistry, negative troponin.  CT scan of the head is negative and EKG reassuring.  However given the patient's concerning story we will discuss with the hospitalist for admission for further workup.  FINAL CLINICAL IMPRESSION(S) / ED DIAGNOSES   Transient ischemic attack   Note:  This document was prepared using Dragon voice recognition software and may include unintentional dictation errors.   Dorothyann Drivers, MD 02/09/24 1005

## 2024-02-09 NOTE — ED Triage Notes (Signed)
 Pt came in via EMS due to numbness on the right side of face and arm. Pt was walking at home to get coffee when she started to feel the numbness, dizziness and believed was having slurred speech when calling out for family member. LKW [id number]. Pt denies any pain at this time. Pt claims symptoms resolved. NO blood thinners. MD at bed side. Initial NIH negative. No acute distress at this time.

## 2024-02-09 NOTE — ED Notes (Signed)
 Pt notified that she would be going into share room. Pt okay with room.

## 2024-02-09 NOTE — Consult Note (Incomplete)
 [place], Kazandra 121 STROKE/TIA?  68 y/o F w/ PMH of depression, RA, DM2, peripheral neuropathy, insomnia, HLD, possible PAF who presented w/ right sided heaviness/tingling x day of admission. Pt also had difficulty talking and walking and these symptoms all resolved within 45 mins. Pt now c/o left sided heaviness/tingling that just started while the pt was in the ER. Pt denies any dizziness, fever, chills, sweating, chest pain, shortness of breath, cough, nausea, vomiting, abd pain, dysuria, urinary urgency, urinary frequency, diarrhea or constipation. Pt has a BM once a week which is normal as per the pt. CT head done showed no acute intracranial abnormalities. Neuro was consulted by ER physician.

## 2024-02-09 NOTE — H&P (Addendum)
 History and Physical    Ellen Taylor FMW:[id number] DOB: August 13, 1955 DOA: 02/09/2024  PCP: [facility], [facility]  Patient coming from: home   Chief Complaint: right sided heaviness/tingling & difficulty talking   HPI: 68 y/o F w/ PMH of depression, RA, DM2, peripheral neuropathy, insomnia, HLD, possible PAF who presented w/ right sided heaviness/tingling x day of admission. Pt also had difficulty talking and walking and these symptoms all resolved within 45 mins. Pt now c/o left sided heaviness/tingling that just started while the pt was in the ER. Pt denies any dizziness, fever, chills, sweating, chest pain, shortness of breath, cough, nausea, vomiting, abd pain, dysuria, urinary urgency, urinary frequency, diarrhea or constipation. Pt has a BM once a week which is normal as per the pt. CT head done showed no acute intracranial abnormalities. Neuro was consulted by ER physician.   Review of Systems: As per HPI otherwise 14 point review of systems negative.    Past Medical History:  Diagnosis Date   Cancer (HCC)    kidney cancer, 2012. Partial removal of left kidney    Carotid arterial disease    a. 09/2023 Carotid U/S: Bilat 1-39% ICA stenoses. Bilat antegrade flow in vertebrals. Nl flow dynamics in bilat subclavian arteries.   Diabetes mellitus without complication (HCC)    Diastolic dysfunction    a. 09/2023 Echo: EF 60-65%, no rwma, GrI DD, nl RV fxn, mildly dil LA, mild MR.   Hyperlipemia    Hypertension    Pre-syncope    a. 09/2023 Zio: triggered events - sinus rhythm.   PSVT (paroxysmal supraventricular tachycardia)    a. 09/2023 Zio: predominantly sinus rhythm, avg 69 (49-133). Rare PACs/PVCs. 2 SVT runs up to 29.3 secs, max rate 167. No sustained arrhythmias or pauses. Triggered events - sinus rhythm.   Rheumatoid arthritis (HCC)     Past Surgical History:  Procedure Laterality Date   BREAST EXCISIONAL BIOPSY Left 20+ YRS AGO   NEG   CESAREAN SECTION      CHOLECYSTECTOMY       reports that she has been smoking cigarettes. She started smoking about 45 years ago. She has a 17.5 pack-year smoking history. She has never used smokeless tobacco. She reports that she does not drink alcohol and does not use drugs.  Allergies[1]  Family History  Problem Relation Age of Onset   Diabetes Mother    Stroke Mother 23   Heart disease Father 89   Breast cancer Sister    Diabetes Brother    Heart disease Brother    Heart disease Brother    Heart disease Brother    Breast cancer Maternal Aunt    Diabetes Maternal Aunt    Rashes / Skin problems Neg Hx      Prior to Admission medications  Medication Sig Start Date End Date Taking? Authorizing Provider  aspirin  81 MG EC tablet Take by mouth. Patient not taking: Reported on 11/11/2023 04/03/16   [provider]  atorvastatin  (LIPITOR) 40 MG tablet Take 40 mg by mouth daily.    [provider]  Cholecalciferol (VITAMIN D) 2000 UNITS CAPS Take 1 capsule by mouth daily.    [provider]  cyclobenzaprine  (FLEXERIL ) 5 MG tablet Take 5 mg by mouth 3 (three) times daily as needed for muscle spasms.    [provider]  diphenhydrAMINE-APAP, sleep, (TYLENOL  PM EXTRA STRENGTH PO) Take by mouth. Patient taking differently: Take 1 tablet by mouth at bedtime.    [provider]  ezetimibe  (ZETIA ) 10 MG tablet Take 10 mg by mouth daily.    [provider]  FLUoxetine  (PROZAC ) 20 MG capsule Take 20 mg by mouth daily.    [provider]  fluticasone (FLONASE) 50 MCG/ACT nasal spray Place into the nose. Patient not taking: Reported on 11/11/2023 12/31/14   [provider]  folic acid  (FOLVITE ) 1 MG tablet Take 1 mg by mouth daily.    [provider]  gabapentin  (NEURONTIN ) 300 MG capsule Take 300 mg by mouth. Patient taking differently: Take 300 mg by mouth 3 (three) times daily as needed (neuropathy).    [provider]   HYDROcodone -acetaminophen  (NORCO) 10-325 MG per tablet Take 1 tablet by mouth every 6 (six) hours as needed for moderate pain.  Patient taking differently: Take 1 tablet by mouth every 6 (six) hours as needed for moderate pain (pain score 4-6). 10-500 prn    [provider]  hydroxychloroquine  (PLAQUENIL ) 200 MG tablet Take 200 mg by mouth. Patient taking differently: Take 200 mg by mouth daily. 11/08/13   [provider]  Insulin  Degludec (TRESIBA [place]) Inject into the skin. Patient taking differently: Inject 27 Units into the skin daily.    [provider]  INVOKANA 100 MG TABS tablet Take 100 mg by mouth every morning.    [provider]  Melatonin 5 MG CAPS Take by mouth. Patient taking differently: Take 10 mg by mouth at bedtime.    [provider]  meloxicam (MOBIC) 7.5 MG tablet Take 7.5 mg by mouth daily. Patient not taking: Reported on 11/11/2023    [provider]  methotrexate (RHEUMATREX) 2.5 MG tablet Take 20 mg by mouth once a week. Caution:Chemotherapy. Protect from light. Patient taking differently: Take 8 tablets by mouth once a week. Caution:Chemotherapy. Protect from light.    [provider]  multivitamin-lutein (OCUVITE-LUTEIN) CAPS capsule Take 1 capsule by mouth daily.    [provider]  nystatin -triamcinolone  (MYCOLOG II) cream Apply 1 application topically 2 (two) times daily. Patient taking differently: Apply 1 application  topically as needed (rash). 09/11/15   Defrancesco, Gladis LABOR, MD  OZEMPIC, 0.25 OR 0.5 MG/DOSE, 2 MG/3ML SOPN Inject 0.5 mg into the skin once a week.    [provider]  sulfaSALAzine (AZULFIDINE) 500 MG tablet Take 500 mg by mouth daily. Patient taking differently: Take 1,000 mg by mouth 2 (two) times daily. 06/17/23   [provider]    Physical Exam: Vitals:   02/09/24 0904 02/09/24 1104 02/09/24 1214 02/09/24 1214  BP: (!) 175/71 (!) 141/72  (!) 153/70   Pulse: 60 (!) 58  66  Resp: 14 15  16   Temp: 97.7 F (36.5 C)   97.6 F (36.4 C)  TempSrc: Oral   Oral  SpO2: 98% 97%  99%  Weight:   78 kg   Height:   5' 2 (1.575 m)     Constitutional: NAD, calm, comfortable Vitals:   02/09/24 0904 02/09/24 1104 02/09/24 1214 02/09/24 1214  BP: (!) 175/71 (!) 141/72  (!) 153/70  Pulse: 60 (!) 58  66  Resp: 14 15  16   Temp: 97.7 F (36.5 C)   97.6 F (36.4 C)  TempSrc: Oral   Oral  SpO2: 98% 97%  99%  Weight:   78 kg   Height:   5' 2 (1.575 m)    Eyes: PERRL, lids and conjunctivae normal ENMT: Mucous membranes are moist.  Neck: normal, supple Respiratory: clear to auscultation bilaterally,  no wheezing, no crackles. Normal respiratory effort. No accessory muscle use.  Cardiovascular: S1/S2+, no rubs / gallops. Abdomen: soft, no tenderness, obese, bowel sounds positive.  Musculoskeletal: no clubbing / cyanosis. Good ROM, no contractures. Normal muscle tone.  Skin: no rashes, lesions, ulcers Neurologic: CN 2-12 grossly intact. Strength 5/5 in all 4.  Psychiatric: Normal judgment and insight. Alert and oriented x 3. Normal mood.   Labs on Admission: I have personally reviewed following labs and imaging studies  CBC: Recent Labs  Lab 02/09/24 0906  WBC 6.5  HGB 15.5*  HCT 48.2*  MCV 89.1  PLT 308   Basic Metabolic Panel: Recent Labs  Lab 02/09/24 0906  NA 138  K 4.0  CL 101  CO2 26  GLUCOSE 144*  BUN 19  CREATININE 0.76  CALCIUM  9.9   GFR: Estimated Creatinine Clearance: 66.9 mL/min (by C-G formula based on SCr of 0.76 mg/dL). Liver Function Tests: Recent Labs  Lab 02/09/24 0906  AST 17  ALT 12  ALKPHOS 88  BILITOT 0.4  PROT 7.5  ALBUMIN 4.3   No results for input(s): LIPASE, AMYLASE in the last 168 hours. No results for input(s): AMMONIA in the last 168 hours. Coagulation Profile: No results for input(s): INR, PROTIME in the last 168 hours. Cardiac Enzymes: No results for input(s): CKTOTAL,  CKMB, CKMBINDEX, TROPONINI in the last 168 hours. BNP (last 3 results) No results for input(s): PROBNP in the last 8760 hours. HbA1C: No results for input(s): HGBA1C in the last 72 hours. CBG: No results for input(s): GLUCAP in the last 168 hours. Lipid Profile: No results for input(s): CHOL, HDL, LDLCALC, TRIG, CHOLHDL, LDLDIRECT in the last 72 hours. Thyroid Function Tests: No results for input(s): TSH, T4TOTAL, FREET4, T3FREE, THYROIDAB in the last 72 hours. Anemia Panel: No results for input(s): VITAMINB12, FOLATE, FERRITIN, TIBC, IRON, RETICCTPCT in the last 72 hours. Urine analysis:    Component Value Date/Time   COLORURINE Yellow 11/15/2013 1450   COLORURINE YELLOW 08/29/2013 1755   APPEARANCEUR Hazy (A) 12/30/2022 1519   LABSPEC 1.011 11/15/2013 1450   PHURINE 5.0 11/15/2013 1450   PHURINE 7.0 08/29/2013 1755   GLUCOSEU 3+ (A) 12/30/2022 1519   GLUCOSEU Negative 11/15/2013 1450   HGBUR Negative 11/15/2013 1450   HGBUR NEGATIVE 08/29/2013 1755   BILIRUBINUR Negative 12/30/2022 1519   BILIRUBINUR Negative 11/15/2013 1450   KETONESUR Negative 11/15/2013 1450   KETONESUR NEGATIVE 08/29/2013 1755   PROTEINUR Trace (A) 12/30/2022 1519   PROTEINUR Negative 11/15/2013 1450   PROTEINUR NEGATIVE 08/29/2013 1755   UROBILINOGEN 1.0 08/29/2013 1755   NITRITE Negative 12/30/2022 1519   NITRITE Negative 11/15/2013 1450   NITRITE NEGATIVE 08/29/2013 1755   LEUKOCYTESUR Trace (A) 12/30/2022 1519   LEUKOCYTESUR Negative 11/15/2013 1450    Radiological Exams on Admission: CT HEAD WO CONTRAST ( ) Result Date: 02/09/2024 EXAM: CT HEAD WITHOUT CONTRAST 02/09/2024 09:20:08 AM TECHNIQUE: CT of the head was performed without the administration of intravenous contrast. Automated exposure control, iterative reconstruction, and/or weight based adjustment of the mA/kV was utilized to reduce the radiation dose to as low as reasonably achievable.  COMPARISON: CT of the head dated 08/29/2013. CLINICAL HISTORY: Neuro deficit, acute, stroke suspected. FINDINGS: BRAIN AND VENTRICLES: No acute hemorrhage. No evidence of acute infarct. No hydrocephalus. No extra-axial collection. No mass effect or midline shift. ORBITS: No acute abnormality. SINUSES: No acute abnormality. SOFT TISSUES AND SKULL: No acute soft tissue abnormality. No skull fracture. IMPRESSION: 1. No acute intracranial abnormality. Electronically signed  by: Evalene Coho MD 02/09/2024 09:30 AM EST RP Workstation: [id number]    EKG: Independently reviewed.   Assessment/Plan Principal Problem:   TIA (transient ischemic attack)   Likely TIA: right sided heaviness, tingling, difficult talking and walking have all resolved within 45 mins. Now w/ left sided heaviness/tingling. CT head shows no acute intracranial findings. Continue on tele, w/ concern for possible hx of PAF. Neuro consulted  DM2: likely poorly controlled. Will start SSI w/ accuchecks. Holding all home anti-DM meds  Depression: severity unknown. Continue on home dose of fluoxetine   RA: continue on home dose of hydroxychloroquine . Takes methotrexate weekly.   HLD: continue on home dose of HLD, zetia   Peripheral neuropathy: continue on home dose of gabapentin   Obesity: BMI 31.4. Would benefit from weight loss   DVT prophylaxis: SCDs Code Status: DNR Family Communication: none at bedside and pt stated I did not to call and update her family Disposition Plan: likely d/c back home  Consults called: neuro, Dr. Matthews as per ER physician Admission status: observation   Anthony CHRISTELLA Pouch MD [facility]   If 7PM-7AM, please contact night-coverage [email or web address]   02/09/2024, 2:21 PM       [1]  Allergies Allergen Reactions   Amoxicillin-Pot Clavulanate Nausea And Vomiting   Metformin And Related Nausea Only   Metformin Hcl     Other Reaction(s): abd cramping

## 2024-02-09 NOTE — Plan of Care (Signed)
 Neurology plan of care  Neurology consulted for possible TIA in this patient with hx 2 brief runs a fib captured on Xio earlier this year, none on subsequent 14 day monitor and not started on anticoagulation, DM2, HL, RA who presented to ED today after acute onset R facial droop, RUE weakness, R face and arm numbness, and dysarthria. I was told by ED that sx had completely resolved after 45 minutes and then she had no deficits. Patient also told me she subsequently had L face numbness but that this also resolved. However on my exam she has R NLF flattening and very slight extensor predominant weakness LUE (NIHSS = 1). Presentation is c/f acute ischemic stroke, potentially cardioembolic given multiple vascular distributions involved in exam findings and hx a fib not on AC.   Recommendations: - Permissive HTN x48 hrs from sx onset or until stroke ruled out by MRI goal BP <220/110. PRN labetalol or hydralazine if BP above these parameters. Avoid oral antihypertensives. - MRI brain wo contrast - CTA H&N - TTE w/ bubble - Check A1c and LDL + add statin per guidelines - ASA 81mg  daily + plavix  75mg  daily x21 days f/b ASA 81mg  daily monotherapy after that - q4 hr neuro checks - STAT head CT for any change in neuro exam - Tele - PT/OT/SLP - Stroke education - Amb referral to neurology upon discharge   Stroke orders placed. Full consult note to follow.  Elida Ross, MD [facility] [phone]  If 7pm- 7am, please page neurology on call as listed in AMION.

## 2024-02-09 NOTE — Consult Note (Incomplete)
 NEUROLOGY CONSULT NOTE   Date of service: February 09, 2024 Patient Name: Ellen Taylor MRN:  [id number] DOB:  21-Dec-1955 Chief Complaint: *** Requesting Provider: Dorothyann Drivers, MD  History of Present Illness  Ellen Taylor is a 68 y.o. female with hx of ***  LKW: *** Modified rankin score: {Modified Rankin Scale:21264} IV Thrombolysis: ***Yes, *** No (reason) EVT: ***Yes, *** No (reason) ICH Score:***  NIHSS components Score: Comment  1a Level of Conscious 0[]  1[]  2[]  3[]      1b LOC Questions 0[]  1[]  2[]       1c LOC Commands 0[]  1[]  2[]       2 Best Gaze 0[]  1[]  2[]       3 Visual 0[]  1[]  2[]  3[]      4 Facial Palsy 0[]  1[]  2[]  3[]      5a Motor Arm - left 0[]  1[]  2[]  3[]  4[]  UN[]    5b Motor Arm - Right 0[]  1[]  2[]  3[]  4[]  UN[]    6a Motor Leg - Left 0[]  1[]  2[]  3[]  4[]  UN[]    6b Motor Leg - Right 0[]  1[]  2[]  3[]  4[]  UN[]    7 Limb Ataxia 0[]  1[]  2[]  UN[]      8 Sensory 0[]  1[]  2[]  UN[]      9 Best Language 0[]  1[]  2[]  3[]      10 Dysarthria 0[]  1[]  2[]  UN[]      11 Extinct. and Inattention 0[]  1[]  2[]       TOTAL:       ROS  ***Comprehensive ROS performed and pertinent positives documented in HPI  ***Unable to ascertain due to ***  Past History   Past Medical History:  Diagnosis Date   Cancer (HCC)    kidney cancer, 2012. Partial removal of left kidney    Carotid arterial disease    a. 09/2023 Carotid U/S: Bilat 1-39% ICA stenoses. Bilat antegrade flow in vertebrals. Nl flow dynamics in bilat subclavian arteries.   Diabetes mellitus without complication (HCC)    Diastolic dysfunction    a. 09/2023 Echo: EF 60-65%, no rwma, GrI DD, nl RV fxn, mildly dil LA, mild MR.   Hyperlipemia    Hypertension    Pre-syncope    a. 09/2023 Zio: triggered events - sinus rhythm.   PSVT (paroxysmal supraventricular tachycardia)    a. 09/2023 Zio: predominantly sinus rhythm, avg 69 (49-133). Rare PACs/PVCs. 2 SVT runs up to 29.3 secs, max rate 167. No sustained arrhythmias or pauses.  Triggered events - sinus rhythm.   Rheumatoid arthritis (HCC)     Past Surgical History:  Procedure Laterality Date   BREAST EXCISIONAL BIOPSY Left 20+ YRS AGO   NEG   CESAREAN SECTION     CHOLECYSTECTOMY      Family History: Family History  Problem Relation Age of Onset   Diabetes Mother    Stroke Mother 55   Heart disease Father 66   Breast cancer Sister    Diabetes Brother    Heart disease Brother    Heart disease Brother    Heart disease Brother    Breast cancer Maternal Aunt    Diabetes Maternal Aunt    Rashes / Skin problems Neg Hx     Social History  reports that she has been smoking cigarettes. She started smoking about 45 years ago. She has a 17.5 pack-year smoking history. She has never used smokeless tobacco. She reports that she does not drink alcohol and does not use drugs.  Allergies[1]  Medications  Current Medications[2]  Vitals   Vitals:  02-10-24 0904  BP: (!) 175/71  Pulse: 60  Resp: 14  Temp: 97.7 F (36.5 C)  TempSrc: Oral  SpO2: 98%    There is no height or weight on file to calculate BMI.   Physical Exam   Constitutional: Appears well-developed and well-nourished. *** Psych: Affect appropriate to situation. *** Eyes: No scleral injection. *** HENT: No OP obstruction. *** Head: Normocephalic. *** Cardiovascular: Normal rate and regular rhythm. *** Respiratory: Effort normal, non-labored breathing. *** GI: Soft.  No distension. There is no tenderness. *** Skin: WDI. ***  Neurologic Examination   ***  Labs/Imaging/Neurodiagnostic studies   CBC:  Recent Labs  Lab 02/10/2024 0906  WBC 6.5  HGB 15.5*  HCT 48.2*  MCV 89.1  PLT 308   Basic Metabolic Panel:  Lab Results  Component Value Date   NA 138 02-10-2024   K 4.0 10-Feb-2024   CO2 26 02/10/24   GLUCOSE 144 (H) February 10, 2024   BUN 19 2024-02-10   CREATININE 0.76 2024-02-10   CALCIUM  9.9 02-10-2024   GFRNONAA >60 2024/02/10   GFRAA >60 11/15/2013   Lipid Panel:   Lab Results  Component Value Date   LDLCALC 91 04/04/2008   HgbA1c: No results found for: HGBA1C Urine Drug Screen:     Component Value Date/Time   LABOPIA POSITIVE 11/15/2013 1450   LABOPIA NONE DETECTED 08/29/2013 1755   COCAINSCRNUR NEGATIVE 11/15/2013 1450   LABBENZ POSITIVE 11/15/2013 1450   LABBENZ NONE DETECTED 08/29/2013 1755   AMPHETMU NEGATIVE 11/15/2013 1450   AMPHETMU NONE DETECTED 08/29/2013 1755   THCU NEGATIVE 11/15/2013 1450   THCU NONE DETECTED 08/29/2013 1755   LABBARB NEGATIVE 11/15/2013 1450   LABBARB NONE DETECTED 08/29/2013 1755    Alcohol Level     Component Value Date/Time   ETH <11 08/29/2013 1640   INR  Lab Results  Component Value Date   INR 1.08 08/29/2013   APTT  Lab Results  Component Value Date   APTT 34 08/29/2013   AED levels: No results found for: PHENYTOIN, ZONISAMIDE, LAMOTRIGINE, LEVETIRACETA  CT Head without contrast(Personally reviewed): ***  CT angio Head and Neck with contrast(Personally reviewed): ***  MR Angio head without contrast and Carotid Duplex BL(Personally reviewed): ***  MRI Brain(Personally reviewed): ***  Neurodiagnostics rEEG:  ***  ASSESSMENT   Ellen Taylor is a 68 y.o. female ***  RECOMMENDATIONS   - Admit for TIA workup - Permissive HTN x48 hrs from sx onset or until stroke ruled out by MRI goal BP <220/110. PRN labetalol or hydralazine if BP above these parameters. Avoid oral antihypertensives. - MRI brain wo contrast - CTA or MRA H&N - TTE - Check A1c and LDL + add statin per guidelines - ASA 81mg  daily + plavix  75mg  daily x21 days f/b ASA 81mg  daily monotherapy after that (patient was previously prescribed aspirin  but completed course and was not taking it prior to admission) - q4 hr neuro checks - STAT head CT for any change in neuro exam - Tele - PT/OT/SLP - Stroke education - Amb referral to neurology upon discharge  Will continue to  follow  ______________________________________________________________________    Signed, Elida CHRISTELLA Ross, MD [facility]     [1]  Allergies Allergen Reactions   Metformin And Related Nausea Only  [2] No current facility-administered medications for this encounter.  Current Outpatient Medications:    aspirin  81 MG EC tablet, Take by mouth. (Patient not taking: Reported on 11/11/2023), Disp: , Rfl:    atorvastatin  (LIPITOR) 40  MG tablet, Take 40 mg by mouth daily., Disp: , Rfl:    Cholecalciferol (VITAMIN D) 2000 UNITS CAPS, Take 1 capsule by mouth daily., Disp: , Rfl:    cyclobenzaprine  (FLEXERIL ) 5 MG tablet, Take 5 mg by mouth 3 (three) times daily as needed for muscle spasms., Disp: , Rfl:    diphenhydrAMINE-APAP, sleep, (TYLENOL  PM EXTRA STRENGTH PO), Take by mouth. (Patient taking differently: Take 1 tablet by mouth at bedtime.), Disp: , Rfl:    ezetimibe  (ZETIA ) 10 MG tablet, Take 10 mg by mouth daily., Disp: , Rfl:    FLUoxetine  (PROZAC ) 20 MG capsule, Take 20 mg by mouth daily., Disp: , Rfl:    fluticasone (FLONASE) 50 MCG/ACT nasal spray, Place into the nose. (Patient not taking: Reported on 11/11/2023), Disp: , Rfl:    folic acid  (FOLVITE ) 1 MG tablet, Take 1 mg by mouth daily., Disp: , Rfl:    gabapentin  (NEURONTIN ) 300 MG capsule, Take 300 mg by mouth. (Patient taking differently: Take 300 mg by mouth 3 (three) times daily as needed (neuropathy).), Disp: , Rfl:    HYDROcodone -acetaminophen  (NORCO) 10-325 MG per tablet, Take 1 tablet by mouth every 6 (six) hours as needed for moderate pain.  (Patient taking differently: Take 1 tablet by mouth every 6 (six) hours as needed for moderate pain (pain score 4-6). 10-500 prn), Disp: , Rfl:    hydroxychloroquine  (PLAQUENIL ) 200 MG tablet, Take 200 mg by mouth. (Patient taking differently: Take 200 mg by mouth daily.), Disp: , Rfl:    Insulin  Degludec (TRESIBA [place]), Inject into the skin. (Patient taking differently: Inject 27  Units into the skin daily.), Disp: , Rfl:    INVOKANA 100 MG TABS tablet, Take 100 mg by mouth every morning., Disp: , Rfl:    Melatonin 5 MG CAPS, Take by mouth. (Patient taking differently: Take 10 mg by mouth at bedtime.), Disp: , Rfl:    meloxicam (MOBIC) 7.5 MG tablet, Take 7.5 mg by mouth daily. (Patient not taking: Reported on 11/11/2023), Disp: , Rfl:    methotrexate (RHEUMATREX) 2.5 MG tablet, Take 20 mg by mouth once a week. Caution:Chemotherapy. Protect from light. (Patient taking differently: Take 8 tablets by mouth once a week. Caution:Chemotherapy. Protect from light.), Disp: , Rfl:    multivitamin-lutein (OCUVITE-LUTEIN) CAPS capsule, Take 1 capsule by mouth daily., Disp: , Rfl:    nystatin -triamcinolone  (MYCOLOG II) cream, Apply 1 application topically 2 (two) times daily. (Patient taking differently: Apply 1 application  topically as needed (rash).), Disp: 30 g, Rfl: 0   OZEMPIC, 0.25 OR 0.5 MG/DOSE, 2 MG/3ML SOPN, Inject 0.5 mg into the skin once a week., Disp: , Rfl:    sulfaSALAzine (AZULFIDINE) 500 MG tablet, Take 500 mg by mouth daily. (Patient taking differently: Take 1,000 mg by mouth 2 (two) times daily.), Disp: , Rfl:

## 2024-02-09 NOTE — ED Notes (Signed)
 Called CCMD to place pt on cardiac monitoring

## 2024-02-10 ENCOUNTER — Observation Stay: Admit: 2024-02-10 | Discharge: 2024-02-10 | Disposition: A | Attending: Neurology | Admitting: Neurology

## 2024-02-10 ENCOUNTER — Observation Stay (HOSPITAL_BASED_OUTPATIENT_CLINIC_OR_DEPARTMENT_OTHER)
Admit: 2024-02-10 | Discharge: 2024-02-10 | Disposition: A | Discharge status: Home / Self Care | Attending: Medical | Admitting: Medical

## 2024-02-10 ENCOUNTER — Other Ambulatory Visit: Payer: Self-pay

## 2024-02-10 DIAGNOSIS — G459 Transient cerebral ischemic attack, unspecified: Secondary | ICD-10-CM | POA: Diagnosis not present

## 2024-02-10 DIAGNOSIS — I639 Cerebral infarction, unspecified: Secondary | ICD-10-CM

## 2024-02-10 LAB — ECHOCARDIOGRAM COMPLETE BUBBLE STUDY
AR max vel: 2.46 cm2
AV Area VTI: 2.41 cm2
AV Area mean vel: 2.32 cm2
AV Mean grad: 2 mmHg
AV Peak grad: 4.3 mmHg
Ao pk vel: 1.04 m/s
Area-P 1/2: 3.72 cm2
Calc EF: 60.1 %
MV VTI: 1.78 cm2
S' Lateral: 2.5 cm
Single Plane A2C EF: 65.7 %
Single Plane A4C EF: 54.5 %

## 2024-02-10 LAB — LIPID PANEL
Cholesterol: 245 mg/dL — ABNORMAL HIGH (ref 0–200)
HDL: 32 mg/dL — ABNORMAL LOW (ref >40)
LDL Cholesterol: 168 mg/dL — ABNORMAL HIGH (ref 0–99)
Total CHOL/HDL Ratio: 7.8 ratio
Triglycerides: 226 mg/dL — ABNORMAL HIGH (ref <150)
VLDL: 45 mg/dL — ABNORMAL HIGH (ref 0–40)

## 2024-02-10 LAB — GLUCOSE, CAPILLARY
Glucose-Capillary: 228 mg/dL — ABNORMAL HIGH (ref 70–99)
Glucose-Capillary: 108 mg/dL — ABNORMAL HIGH (ref 70–99)

## 2024-02-10 LAB — HEMOGLOBIN A1C
Hgb A1c MFr Bld: 6.4 % — ABNORMAL HIGH (ref 4.8–5.6)
Mean Plasma Glucose: 136.98 mg/dL

## 2024-02-10 LAB — HIV ANTIBODY (ROUTINE TESTING W REFLEX): HIV Screen 4th Generation wRfx: NONREACTIVE

## 2024-02-10 MED ORDER — ATORVASTATIN CALCIUM 20 MG PO TABS: 80.0000 mg | ORAL_TABLET | Freq: Every day | ORAL | Status: DC

## 2024-02-10 MED ORDER — ATORVASTATIN CALCIUM 80 MG PO TABS
80.0000 mg | ORAL_TABLET | Freq: Every day | ORAL | 0 refills | Status: AC
  Filled 2024-02-10: qty 30, 30d supply, fill #0

## 2024-02-10 MED ORDER — CLOPIDOGREL BISULFATE 75 MG PO TABS
75.0000 mg | ORAL_TABLET | Freq: Every day | ORAL | 0 refills | Status: AC
  Filled 2024-02-10: qty 20, 20d supply, fill #0

## 2024-02-10 NOTE — Consult Note (Signed)
 NEUROLOGY CONSULT NOTE   Date of service: 02/09/24 Patient Name: Ellen Taylor MRN:  [id number] DOB:  [birth year] Chief Complaint: TIA Requesting Provider: Trudy Anthony HERO, MD  History of Present Illness   Neurology consulted for possible TIA in this 68 yo patient with hx 2 brief runs a fib captured on Xio earlier this year, none on subsequent 14 day monitor and not started on anticoagulation, DM2, HL, RA who presented to ED today after acute onset R facial droop, RUE weakness, R face and arm numbness, and dysarthria. LKW [id number]. I was told by ED that sx had completely resolved after 45 minutes and then she had no deficits. Patient also told me she subsequently had L face numbness but that this also resolved. However on my exam she has R NLF flattening and very slight extensor predominant weakness LUE (NIHSS = 1).    ROS   Comprehensive ROS performed and pertinent positives documented in HPI   Past History   Past Medical History:  Diagnosis Date   Cancer (HCC)    kidney cancer, 2012. Partial removal of left kidney    Carotid arterial disease    a. 09/2023 Carotid U/S: Bilat 1-39% ICA stenoses. Bilat antegrade flow in vertebrals. Nl flow dynamics in bilat subclavian arteries.   Diabetes mellitus without complication (HCC)    Diastolic dysfunction    a. 09/2023 Echo: EF 60-65%, no rwma, GrI DD, nl RV fxn, mildly dil LA, mild MR.   Hyperlipemia    Hypertension    Pre-syncope    a. 09/2023 Zio: triggered events - sinus rhythm.   PSVT (paroxysmal supraventricular tachycardia)    a. 09/2023 Zio: predominantly sinus rhythm, avg 69 (49-133). Rare PACs/PVCs. 2 SVT runs up to 29.3 secs, max rate 167. No sustained arrhythmias or pauses. Triggered events - sinus rhythm.   Rheumatoid arthritis (HCC)     Past Surgical History:  Procedure Laterality Date   BREAST EXCISIONAL BIOPSY Left 20+ YRS AGO   NEG   CESAREAN SECTION     CHOLECYSTECTOMY      Family History: Family History  Problem  Relation Age of Onset   Diabetes Mother    Stroke Mother 11   Heart disease Father 92   Breast cancer Sister    Diabetes Brother    Heart disease Brother    Heart disease Brother    Heart disease Brother    Breast cancer Maternal Aunt    Diabetes Maternal Aunt    Rashes / Skin problems Neg Hx     Social History  reports that she has been smoking cigarettes. She started smoking about 45 years ago. She has a 17.5 pack-year smoking history. She has never used smokeless tobacco. She reports that she does not drink alcohol and does not use drugs.  Allergies[1]  Medications  Current Medications[2]  Vitals   Vitals:   02/09/24 1950 02/09/24 2350 02/10/24 0007 02/10/24 0350  BP: (!) 143/56 (!) 153/63 (!) 142/65 (!) 150/59  Pulse: 68 (!) 52 (!) 50 (!) 54  Resp: 16 19  16   Temp: 97.6 F (36.4 C) 97.7 F (36.5 C) 97.8 F (36.6 C) 98.6 F (37 C)  TempSrc: Oral Oral Oral Oral  SpO2: 98% 98% 99% 97%  Weight:      Height:        Body mass index is 31.46 kg/m.   Physical Exam   Gen: patient lying in bed, NAD CV: extremities appear well-perfused Resp: normal WOB  Neurologic exam MS: alert,  oriented x4, follows commands Speech: no dysarthria, no aphasia CN: PERRL, VFF, EOMI, sensation intact, R NLF flattening, hearing intact to voice Motor: slightly extensor-predominant weakness LUE but no drift in any extremity Sensory: SILT Reflexes: 2+ symm with toes down bilat Coordination: FNF intact bilat Gait: deferred   Labs/Imaging/Neurodiagnostic studies   CBC:  Recent Labs  Lab 02-28-24 0906  WBC 6.5  HGB 15.5*  HCT 48.2*  MCV 89.1  PLT 308   Basic Metabolic Panel:  Lab Results  Component Value Date   NA 138 Feb 28, 2024   K 4.0 02-28-24   CO2 26 2024-02-28   GLUCOSE 144 (H) Feb 28, 2024   BUN 19 02-28-24   CREATININE 0.76 02-28-2024   CALCIUM  9.9 02-28-2024   GFRNONAA >60 02/28/24   GFRAA >60 11/15/2013   Lipid Panel:  Lab Results  Component Value  Date   LDLCALC 91 04/04/2008   HgbA1c:  Lab Results  Component Value Date   HGBA1C 6.4 (H) 28-Feb-2024   Urine Drug Screen:     Component Value Date/Time   LABOPIA POSITIVE 11/15/2013 1450   LABOPIA NONE DETECTED 08/29/2013 1755   COCAINSCRNUR NEGATIVE 11/15/2013 1450   LABBENZ POSITIVE 11/15/2013 1450   LABBENZ NONE DETECTED 08/29/2013 1755   AMPHETMU NEGATIVE 11/15/2013 1450   AMPHETMU NONE DETECTED 08/29/2013 1755   THCU NEGATIVE 11/15/2013 1450   THCU NONE DETECTED 08/29/2013 1755   LABBARB NEGATIVE 11/15/2013 1450   LABBARB NONE DETECTED 08/29/2013 1755    Alcohol Level     Component Value Date/Time   ETH <11 08/29/2013 1640   INR  Lab Results  Component Value Date   INR 1.08 08/29/2013   APTT  Lab Results  Component Value Date   APTT 34 08/29/2013   AED levels: No results found for: PHENYTOIN, ZONISAMIDE, LAMOTRIGINE, LEVETIRACETA  CT Head without contrast(Personally reviewed): No acute process  ASSESSMENT   Neurology consulted for possible TIA in this patient with hx 2 brief runs a fib captured on Xio earlier this year, none on subsequent 14 day monitor and not started on anticoagulation, DM2, HL, RA who presented to ED today after acute onset R facial droop, RUE weakness, R face and arm numbness, and dysarthria. I was told by ED that sx had completely resolved after 45 minutes and then she had no deficits. Patient also told me she subsequently had L face numbness but that this also resolved. However on my exam she has R NLF flattening and very slight extensor predominant weakness LUE (NIHSS = 1).   Presentation is c/f acute ischemic stroke, potentially cardioembolic given multiple vascular distributions involved in exam findings and hx a fib not on AC.   RECOMMENDATIONS   - Permissive HTN x48 hrs from sx onset or until stroke ruled out by MRI goal BP <220/110. PRN labetalol or hydralazine if BP above these parameters. Avoid oral antihypertensives. -  MRI brain wo contrast - CTA H&N - TTE w/ bubble - Check A1c and LDL + add statin per guidelines - ASA 81mg  daily + plavix  75mg  daily x21 days f/b ASA 81mg  daily monotherapy after that - q4 hr neuro checks - STAT head CT for any change in neuro exam - Tele - PT/OT/SLP - Stroke education - Amb referral to neurology upon discharge    Stroke orders placed. Will continue to follow. ______________________________________________________________________    Bonney Elida CHRISTELLA Matthews, MD [facility]  All text copied and pasted here was written by me in a separate note that was not  billed for.    [1]  Allergies Allergen Reactions   Amoxicillin-Pot Clavulanate Nausea And Vomiting   Metformin And Related Nausea Only   Metformin Hcl     Other Reaction(s): abd cramping  [2]  Current Facility-Administered Medications:     stroke: early stages of recovery book, , Does not apply, Once, Matthews Elida HERO, MD   acetaminophen  (TYLENOL ) tablet 650 mg, 650 mg, Oral, Q6H PRN, 650 mg at 02/09/24 1833 **OR** acetaminophen  (TYLENOL ) suppository 650 mg, 650 mg, Rectal, Q6H PRN, Trudy Anthony HERO, MD   [COMPLETED] aspirin  tablet 325 mg, 325 mg, Oral, Once, 325 mg at 02/09/24 2145 **FOLLOWED BY** aspirin  EC tablet 81 mg, 81 mg, Oral, Daily, Matthews Elida HERO, MD   atorvastatin  (LIPITOR) tablet 40 mg, 40 mg, Oral, Daily, Trudy, Jamiese M, MD, 40 mg at 02/09/24 1500   clopidogrel  (PLAVIX ) tablet 75 mg, 75 mg, Oral, Daily, Matthews Elida HERO, MD, 75 mg at 02/09/24 2145   cyclobenzaprine  (FLEXERIL ) tablet 5 mg, 5 mg, Oral, TID PRN, Trudy Anthony HERO, MD   ezetimibe  (ZETIA ) tablet 10 mg, 10 mg, Oral, Daily, Trudy Anthony M, MD, 10 mg at 02/09/24 1500   FLUoxetine  (PROZAC ) capsule 20 mg, 20 mg, Oral, Daily, Trudy Anthony M, MD, 20 mg at 02/09/24 1500   folic acid  (FOLVITE ) tablet 1 mg, 1 mg, Oral, Daily, Trudy Anthony HERO, MD, 1 mg at 02/09/24 1500   gabapentin  (NEURONTIN ) capsule 300  mg, 300 mg, Oral, TID PRN, Trudy Anthony HERO, MD, 300 mg at 02/09/24 2154   HYDROcodone -acetaminophen  (NORCO/VICODIN) 5-325 MG per tablet 1-2 tablet, 1-2 tablet, Oral, Q4H PRN, Trudy Anthony HERO, MD   hydroxychloroquine  (PLAQUENIL ) tablet 200 mg, 200 mg, Oral, Daily, Trudy Anthony M, MD, 200 mg at 02/09/24 1500   insulin  aspart (novoLOG ) injection 0-6 Units, 0-6 Units, Subcutaneous, TID WC, Trudy Anthony HERO, MD   morphine  (PF) 2 MG/ML injection 1 mg, 1 mg, Intravenous, Q4H PRN, Trudy Anthony HERO, MD   ondansetron  (ZOFRAN ) tablet 4 mg, 4 mg, Oral, Q6H PRN **OR** ondansetron  (ZOFRAN ) injection 4 mg, 4 mg, Intravenous, Q6H PRN, Trudy Anthony HERO, MD   senna-docusate (Senokot-S) tablet 1 tablet, 1 tablet, Oral, QHS PRN, Trudy Anthony HERO, MD, 1 tablet at 02/09/24 2145   sodium chloride  flush (NS) 0.9 % injection 3 mL, 3 mL, Intravenous, Q12H, Trudy Anthony HERO, MD, 3 mL at 02/09/24 2154

## 2024-02-10 NOTE — Progress Notes (Signed)
 OT Cancellation Note  Patient Details Name: Ellen Taylor MRN: [id number] DOB: [birth year]   Cancelled Treatment:    Reason Eval/Treat Not Completed: OT screened, no needs identified, will sign off. Pt reports she is back to her baseline, has no further deficits and has been up to the bathroom without difficulty. Eating breakfast without issues, no UE deficits noted during assessment. OT to sign off in house with no further needs indicated.  Hairo Garraway E Chrismon 02/10/2024, 8:20 AM

## 2024-02-10 NOTE — Discharge Summary (Addendum)
 Physician Discharge Summary  Ellen Taylor FMW:[id number] DOB: 11-07-1955 DOA: 02/09/2024  PCP: [facility]  Admit date: 02/09/2024 Discharge date: 02/10/2024  Admitted From: home  Disposition:  home   Recommendations for Outpatient Follow-up:  Follow up with PCP in 1-2 weeks F/u w/ cardio, Dr. Mady, in 1-2 weeks F/u w/ neuro in 8-12 weeks   Home Health: no  Equipment/Devices:  Discharge Condition: stable CODE STATUS: DNR Diet recommendation: Carb Modified  Brief/Interim Summary: HPI: 68 y/o F w/ PMH of depression, RA, DM2, peripheral neuropathy, insomnia, HLD, possible PAF who presented w/ right sided heaviness/tingling x day of admission. Pt also had difficulty talking and walking and these symptoms all resolved within 45 mins. Pt now c/o left sided heaviness/tingling that just started while the pt was in the ER. Pt denies any dizziness, fever, chills, sweating, chest pain, shortness of breath, cough, nausea, vomiting, abd pain, dysuria, urinary urgency, urinary frequency, diarrhea or constipation. Pt has a BM once a week which is normal as per the pt. CT head done showed no acute intracranial abnormalities. Neuro was consulted by ER physician.   Discharge Diagnoses:  Principal Problem:   TIA (transient ischemic attack)  Likely TIA: right sided heaviness, tingling, difficult talking and walking have all resolved within 45 mins. Now w/ left sided heaviness/tingling. CT head shows no acute intracranial findings. MRI brain shows no acute intracranial findings. Continue on aspirin , plavix  x 21 days and then aspirin  monotherapy thereafter as per neuro. Increase atorvastatin  to 80mg  as per neuro. Zio patch placed prior to d/c. Echo bubble study was neg. Continue on tele, w/ concern for possible hx of PAF. Neuro consulted   DM2: likely poorly controlled. Continue on SSI w/ accuchecks. Restart home anti-DMs at d/c    Depression: severity unknown. Continue on home dose  of fluoxetine    RA: continue on home dose of hydroxychloroquine . Takes methotrexate weekly.    HLD: continue on home dose of HLD, zetia    Peripheral neuropathy: continue on home dose of gabapentin    Obesity: BMI 31.4. Would benefit from weight loss     Discharge Instructions  Discharge Instructions     Ambulatory referral to Neurology   Complete by: As directed    Diet Carb Modified   Complete by: As directed    Discharge instructions   Complete by: As directed    F/u w/ PCP in 1-2 weeks. F/u w/ cardio, Dr. Mady, in 1-2 weeks. F/u w/ neuro in 8-12 weeks.   Discharge instructions   Complete by: As directed    Will need a thyroid US  outpatient for multinodular thyroid seen on CTA   Increase activity slowly   Complete by: As directed       Allergies as of 02/10/2024       Reactions   Amoxicillin-pot Clavulanate Nausea And Vomiting   Metformin And Related Nausea Only   Metformin Hcl    Other Reaction(s): abd cramping        Medication List     STOP taking these medications    azithromycin 250 MG tablet Commonly known as: ZITHROMAX   fluticasone 50 MCG/ACT nasal spray Commonly known as: FLONASE   meloxicam 7.5 MG tablet Commonly known as: MOBIC   nystatin -triamcinolone  cream Commonly known as: MYCOLOG II       TAKE these medications    aspirin  EC 81 MG tablet Take by mouth.   atorvastatin  80 MG tablet Commonly known as: LIPITOR Take 1 tablet (80 mg total) by mouth  daily. Start taking on: February 11, 2024 What changed:  medication strength how much to take   clopidogrel  75 MG tablet Commonly known as: PLAVIX  Take 1 tablet (75 mg total) by mouth daily for 20 days. Start taking on: February 11, 2024   cyanocobalamin 100 MCG tablet Take 100 mcg by mouth daily.   cyclobenzaprine  5 MG tablet Commonly known as: FLEXERIL  Take 5 mg by mouth 3 (three) times daily as needed for muscle spasms.   ezetimibe  10 MG tablet Commonly known as:  ZETIA  Take 10 mg by mouth daily.   FLUoxetine  20 MG capsule Commonly known as: PROZAC  Take 20 mg by mouth daily.   folic acid  1 MG tablet Commonly known as: FOLVITE  Take 1 mg by mouth daily.   gabapentin  300 MG capsule Commonly known as: NEURONTIN  Take 300 mg by mouth. What changed:  when to take this reasons to take this   HYDROcodone -acetaminophen  10-325 MG tablet Commonly known as: NORCO Take 1 tablet by mouth every 6 (six) hours as needed for moderate pain. What changed: additional instructions   hydroxychloroquine  200 MG tablet Commonly known as: PLAQUENIL  Take 200 mg by mouth. What changed: when to take this   Invokana 100 MG Tabs tablet Generic drug: canagliflozin Take 100 mg by mouth every morning.   Melatonin 5 MG Caps Take by mouth. What changed:  how much to take when to take this   methotrexate 2.5 MG tablet Commonly known as: RHEUMATREX Take 20 mg by mouth once a week. Caution:Chemotherapy. Protect from light.   multivitamin-lutein Caps capsule Take 1 capsule by mouth daily.   Ozempic (0.25 or 0.5 MG/DOSE) 2 MG/3ML Sopn Generic drug: Semaglutide(0.25 or 0.5MG /DOS) Inject 0.5 mg into the skin once a week.   sulfaSALAzine 500 MG tablet Commonly known as: AZULFIDINE Take 500 mg by mouth daily. What changed:  how much to take when to take this   TRESIBA [place] Inject into the skin. What changed:  how much to take when to take this   TYLENOL  PM EXTRA STRENGTH PO Take by mouth. What changed:  how much to take when to take this   Vitamin D 50 MCG (2000 UT) Caps Take 1 capsule by mouth daily.        Allergies[1]  Consultations:  Neuro   Procedures/Studies: ECHOCARDIOGRAM COMPLETE BUBBLE STUDY Result Date: 02/10/2024    ECHOCARDIOGRAM REPORT   Patient Name:   Ellen Taylor Date of Exam: 02/10/2024 Medical Rec #:  [id number]      Height:       62.0 in Accession #:    [id number]     Weight:       172.0 lb Date of Birth:  Apr 14, 1955       BSA:          1.793 m Patient Age:    68 years       BP:           150/74 mmHg Patient Gender: F              HR:           59 bpm. Exam Location:  [facility] Procedure: 2D Echo, Cardiac Doppler, Color Doppler and Saline Contrast Bubble            Study (Both Spectral and Color Flow Doppler were utilized during            procedure). Indications:     Stroke 434.91 / I63.9  History:  Patient has prior history of Echocardiogram examinations, most                  recent 10/22/2023. Stroke.  Sonographer:     Ashley McNeely-Sloane Referring Phys:  [id number] ELIDA HERO STACK Diagnosing Phys: Evalene Lunger MD IMPRESSIONS  1. Left ventricular ejection fraction, by estimation, is 60 to 65%. The left ventricle has normal function. The left ventricle has no regional wall motion abnormalities. Left ventricular diastolic parameters are consistent with Grade I diastolic dysfunction (impaired relaxation).  2. Right ventricular systolic function is normal. The right ventricular size is normal. Tricuspid regurgitation signal is inadequate for assessing PA pressure.  3. The mitral valve is normal in structure. No evidence of mitral valve regurgitation. No evidence of mitral stenosis.  4. The aortic valve is normal in structure. Aortic valve regurgitation is not visualized. No aortic stenosis is present.  5. The inferior vena cava is normal in size with greater than 50% respiratory variability, suggesting right atrial pressure of 3 mmHg.  6. Agitated saline contrast bubble study was negative, with no evidence of any interatrial shunt. FINDINGS  Left Ventricle: Left ventricular ejection fraction, by estimation, is 60 to 65%. The left ventricle has normal function. The left ventricle has no regional wall motion abnormalities. Strain was performed and the global longitudinal strain is indeterminate. The left ventricular internal cavity size was normal in size. There is no left ventricular hypertrophy. Left ventricular diastolic parameters  are consistent with Grade I diastolic dysfunction (impaired relaxation). Right Ventricle: The right ventricular size is normal. No increase in right ventricular wall thickness. Right ventricular systolic function is normal. Tricuspid regurgitation signal is inadequate for assessing PA pressure. Left Atrium: Left atrial size was normal in size. Right Atrium: Right atrial size was normal in size. Pericardium: There is no evidence of pericardial effusion. Mitral Valve: The mitral valve is normal in structure. No evidence of mitral valve regurgitation. No evidence of mitral valve stenosis. MV peak gradient, 3.7 mmHg. The mean mitral valve gradient is 1.0 mmHg. Tricuspid Valve: The tricuspid valve is normal in structure. Tricuspid valve regurgitation is not demonstrated. No evidence of tricuspid stenosis. Aortic Valve: The aortic valve is normal in structure. Aortic valve regurgitation is not visualized. No aortic stenosis is present. Aortic valve mean gradient measures 2.0 mmHg. Aortic valve peak gradient measures 4.3 mmHg. Aortic valve area, by VTI measures 2.41 cm. Pulmonic Valve: The pulmonic valve was normal in structure. Pulmonic valve regurgitation is not visualized. No evidence of pulmonic stenosis. Aorta: The aortic root is normal in size and structure. Venous: The inferior vena cava is normal in size with greater than 50% respiratory variability, suggesting right atrial pressure of 3 mmHg. IAS/Shunts: No atrial level shunt detected by color flow Doppler. Agitated saline contrast was given intravenously to evaluate for intracardiac shunting. Agitated saline contrast bubble study was negative, with no evidence of any interatrial shunt. There  is no evidence of a patent foramen ovale. There is no evidence of an atrial septal defect. Additional Comments: 3D was performed not requiring image post processing on an independent workstation and was indeterminate.  LEFT VENTRICLE PLAX 2D LVIDd:         4.10 cm      Diastology LVIDs:         2.50 cm     LV e' medial:    5.00 cm/s LV PW:         1.10 cm     LV E/e' medial:  14.8 LV IVS:        0.90 cm     LV e' lateral:   4.90 cm/s LVOT diam:     1.90 cm     LV E/e' lateral: 15.1 LV SV:         58 LV SV Index:   32 LVOT Area:     2.84 cm  LV Volumes (MOD) LV vol d, MOD A2C: 37.6 ml LV vol d, MOD A4C: 34.5 ml LV vol s, MOD A2C: 12.9 ml LV vol s, MOD A4C: 15.7 ml LV SV MOD A2C:     24.7 ml LV SV MOD A4C:     34.5 ml LV SV MOD BP:      21.7 ml RIGHT VENTRICLE             IVC RV Basal diam:  3.60 cm     IVC diam: 1.40 cm RV Mid diam:    2.70 cm RV S prime:     11.10 cm/s TAPSE (M-mode): 2.2 cm LEFT ATRIUM             Index        RIGHT ATRIUM           Index LA diam:        3.80 cm 2.12 cm/m   RA Area:     13.20 cm LA Vol (A2C):   44.2 ml 24.65 ml/m  RA Volume:   27.30 ml  15.23 ml/m LA Vol (A4C):   28.0 ml 15.62 ml/m LA Biplane Vol: 35.9 ml 20.02 ml/m  AORTIC VALVE                    PULMONIC VALVE AV Area (Vmax):    2.46 cm     PV Vmax:        1.14 m/s AV Area (Vmean):   2.32 cm     PV Vmean:       78.400 cm/s AV Area (VTI):     2.41 cm     PV VTI:         0.275 m AV Vmax:           104.00 cm/s  PV Peak grad:   5.2 mmHg AV Vmean:          68.800 cm/s  PV Mean grad:   3.0 mmHg AV VTI:            0.241 m      RVOT Peak grad: 4 mmHg AV Peak Grad:      4.3 mmHg AV Mean Grad:      2.0 mmHg LVOT Vmax:         90.30 cm/s LVOT Vmean:        56.300 cm/s LVOT VTI:          0.205 m LVOT/AV VTI ratio: 0.85  AORTA Ao Root diam: 2.40 cm Ao Asc diam:  3.10 cm MITRAL VALVE MV Area (PHT): 3.72 cm    SHUNTS MV Area VTI:   1.78 cm    Systemic VTI:  0.20 m MV Peak grad:  3.7 mmHg    Systemic Diam: 1.90 cm MV Mean grad:  1.0 mmHg    Pulmonic VTI:  0.235 m MV Vmax:       0.97 m/s MV Vmean:      54.7 cm/s MV Decel Time: 204 msec MV E velocity: 73.90 cm/s MV A velocity: 89.10 cm/s MV E/A ratio:  0.83 Evalene Lunger MD Electronically  signed by Evalene Lunger MD Signature Date/Time:  02/10/2024/1:04:03 PM    Final    CT ANGIO HEAD NECK W WO CM Result Date: 02/10/2024 CLINICAL DATA:  Initial evaluation for acute neuro deficit, stroke. EXAM: CT ANGIOGRAPHY HEAD AND NECK WITH AND WITHOUT CONTRAST TECHNIQUE: Multidetector CT imaging of the head and neck was performed using the standard protocol during bolus administration of intravenous contrast. Multiplanar CT image reconstructions and MIPs were obtained to evaluate the vascular anatomy. Carotid stenosis measurements (when applicable) are obtained utilizing NASCET criteria, using the distal internal carotid diameter as the denominator. RADIATION DOSE REDUCTION: This exam was performed according to the departmental dose-optimization program which includes automated exposure control, adjustment of the mA and/or kV according to patient size and/or use of iterative reconstruction technique. CONTRAST:  75mL OMNIPAQUE  IOHEXOL  350 MG/ML SOLN COMPARISON:  CT from earlier the same day. FINDINGS: CTA NECK FINDINGS Aortic arch: Visualized aortic arch within normal limits for caliber standard branch pattern. Mild aortic atherosclerosis. No stenosis about the origin the great vessels. Right carotid system: Right common and internal carotid arteries are patent without dissection. Atheromatous change about the right carotid bulb without hemodynamically significant greater than 50% stenosis. Left carotid system: Left common and internal carotid arteries are patent without dissection. Calcified plaque about the left carotid bulb without hemodynamically significant greater than 50% stenosis. Vertebral arteries: Both vertebral arteries arise from subclavian arteries. Right vertebral artery dominant. Proximal left vertebral artery not well seen due to adjacent venous contamination. Visualized vertebral arteries patent without stenosis or dissection. Skeleton: No worrisome osseous lesions. Moderate spondylosis present at C5-6 and C6-7. Osteoarthritic changes noted  about the C1-2 articulations. Other neck: No other acute finding. Enlarged heterogeneous multinodular thyroid. Upper chest: 4 mm pulmonary nodule present within the right upper lobe (series 4, image 7). No other acute finding. Review of the MIP images confirms the above findings CTA HEAD FINDINGS Anterior circulation: Atheromatous change about the carotid siphons without hemodynamically significant stenosis. A1 segments patent bilaterally. Normal anterior communicating artery complex. Anterior cerebral arteries patent without significant stenosis. No M1 stenosis or occlusion. No proximal MCA branch occlusion. Distal MCA branches perfused and symmetric. Posterior circulation: Dominant right V4 segment widely patent. Right PICA patent. Hypoplastic left vertebral artery terminates in PICA. Left PICA patent as well. Basilar diminutive but patent without stenosis. Superior cerebral arteries patent bilaterally. Fetal type origin left PCA. Right PCA supplied via a hypoplastic right P1 segment and robust right posterior communicating artery. Right PCA patent to its distal aspect without stenosis. Focal mild-to-moderate stenosis involving the mid left P2 segment (series 9, image 20). Venous sinuses: Patent allowing for timing the contrast bolus. Anatomic variants: As above.  No aneurysm. Review of the MIP images confirms the above findings IMPRESSION: 1. Negative CTA for large vessel occlusion or other emergent finding. 2. Mild atheromatous change about the carotid bifurcations and carotid siphons without hemodynamically significant stenosis. 3. 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 4. Enlarged heterogeneous multinodular thyroid. Further evaluation with dedicated nonemergent thyroid ultrasound recommended. 5. 4 mm right upper lobe pulmonary nodule. Per Fleischner Society Guidelines, if patient is low risk for malignancy, no routine follow-up imaging is recommended. If patient is high risk for  malignancy, a non-contrast Chest CT at 12 months is optional. If performed and the nodule is stable at 12 months, no further follow-up is recommended. Aortic Atherosclerosis (ICD10-I70.0). Electronically Signed   By: Morene Hoard M.D.   On: 02/10/2024 02:42   MR BRAIN  WO CONTRAST Result Date: 02/10/2024 CLINICAL DATA:  Initial evaluation for acute neuro deficit, stroke suspected. EXAM: MRI HEAD WITHOUT CONTRAST TECHNIQUE: Multiplanar, multiecho pulse sequences of the brain and surrounding structures were obtained without intravenous contrast. COMPARISON:  Comparison made with CTs from earlier the same day. FINDINGS: Brain: Cerebral volume within normal limits. Scattered patchy T2/FLAIR hyperintensity involving the supratentorial cerebral white matter, most characteristic of chronic microvascular ischemic disease, mild for age. No evidence for acute or subacute infarct. Or is a chronic cortical infarction. No acute or significant chronic intracranial blood products. No mass lesion, midline shift or mass effect. No hydrocephalus or extra-axial fluid collection. Pituitary gland within normal limits. Vascular: Major intracranial vascular flow voids are maintained. Skull and upper cervical spine: Craniocervical junction within normal limits. Bone marrow signal intensity normal. No scalp soft tissue abnormality. Sinuses/Orbits: Globes orbital soft tissues within normal limits. Mild scattered mucosal thickening present about the ethmoidal air cells. Paranasal sinuses are otherwise clear. No mastoid effusion. Other: None. IMPRESSION: 1. No acute intracranial abnormality. 2. Mild chronic microvascular ischemic disease for age. Electronically Signed   By: Morene Hoard M.D.   On: 02/10/2024 01:05   CT HEAD WO CONTRAST ( ) Result Date: 02/09/2024 EXAM: CT HEAD WITHOUT CONTRAST 02/09/2024 09:20:08 AM TECHNIQUE: CT of the head was performed without the administration of intravenous contrast. Automated  exposure control, iterative reconstruction, and/or weight based adjustment of the mA/kV was utilized to reduce the radiation dose to as low as reasonably achievable. COMPARISON: CT of the head dated 08/29/2013. CLINICAL HISTORY: Neuro deficit, acute, stroke suspected. FINDINGS: BRAIN AND VENTRICLES: No acute hemorrhage. No evidence of acute infarct. No hydrocephalus. No extra-axial collection. No mass effect or midline shift. ORBITS: No acute abnormality. SINUSES: No acute abnormality. SOFT TISSUES AND SKULL: No acute soft tissue abnormality. No skull fracture. IMPRESSION: 1. No acute intracranial abnormality. Electronically signed by: Evalene Coho MD 02/09/2024 09:30 AM EST RP Workstation: [id number]   (Echo, Carotid, EGD, Colonoscopy, ERCP)    Subjective: Pt c/o fatigue    Discharge Exam: Vitals:   02/10/24 0735 02/10/24 1140  BP: (!) 150/74 (!) 164/66  Pulse: 61 (!) 57  Resp: 16 16  Temp: (!) 97.5 F (36.4 C) 97.8 F (36.6 C)  SpO2: 99% 97%   Vitals:   02/10/24 0007 02/10/24 0350 02/10/24 0735 02/10/24 1140  BP: (!) 142/65 (!) 150/59 (!) 150/74 (!) 164/66  Pulse: (!) 50 (!) 54 61 (!) 57  Resp:  16 16 16   Temp: 97.8 F (36.6 C) 98.6 F (37 C) (!) 97.5 F (36.4 C) 97.8 F (36.6 C)  TempSrc: Oral Oral    SpO2: 99% 97% 99% 97%  Weight:      Height:        General: Pt is alert, awake, not in acute distress Cardiovascular: S1/S2 +, no rubs, no gallops Respiratory: CTA bilaterally, no wheezing, no rhonchi Abdominal: Soft, NT, obese, bowel sounds + Extremities:no cyanosis    The results of significant diagnostics from this hospitalization (including imaging, microbiology, ancillary and laboratory) are listed below for reference.     Microbiology: No results found for this or any previous visit (from the past 240 hours).   Labs: BNP (last 3 results) No results for input(s): BNP in the last 8760 hours. Basic Metabolic Panel: Recent Labs  Lab 02/09/24 0906  NA  138  K 4.0  CL 101  CO2 26  GLUCOSE 144*  BUN 19  CREATININE 0.76  CALCIUM  9.9   Liver Function  Tests: Recent Labs  Lab 02/09/24 0906  AST 17  ALT 12  ALKPHOS 88  BILITOT 0.4  PROT 7.5  ALBUMIN 4.3   No results for input(s): LIPASE, AMYLASE in the last 168 hours. No results for input(s): AMMONIA in the last 168 hours. CBC: Recent Labs  Lab 02/09/24 0906  WBC 6.5  HGB 15.5*  HCT 48.2*  MCV 89.1  PLT 308   Cardiac Enzymes: No results for input(s): CKTOTAL, CKMB, CKMBINDEX, TROPONINI in the last 168 hours. BNP: Invalid input(s): POCBNP CBG: Recent Labs  Lab 02/09/24 1651 02/09/24 2049 02/10/24 0835 02/10/24 1141  GLUCAP 133* 159* 228* 108*   D-Dimer No results for input(s): DDIMER in the last 72 hours. Hgb A1c Recent Labs    02/09/24 0906  HGBA1C 6.4*   Lipid Profile Recent Labs    02/10/24 0549  CHOL 245*  HDL 32*  LDLCALC 168*  TRIG 226*  CHOLHDL 7.8   Thyroid function studies No results for input(s): TSH, T4TOTAL, T3FREE, THYROIDAB in the last 72 hours.  Invalid input(s): FREET3 Anemia work up No results for input(s): VITAMINB12, FOLATE, FERRITIN, TIBC, IRON, RETICCTPCT in the last 72 hours. Urinalysis    Component Value Date/Time   COLORURINE Yellow 11/15/2013 1450   COLORURINE YELLOW 08/29/2013 1755   APPEARANCEUR Hazy (A) 12/30/2022 1519   LABSPEC 1.011 11/15/2013 1450   PHURINE 5.0 11/15/2013 1450   PHURINE 7.0 08/29/2013 1755   GLUCOSEU 3+ (A) 12/30/2022 1519   GLUCOSEU Negative 11/15/2013 1450   HGBUR Negative 11/15/2013 1450   HGBUR NEGATIVE 08/29/2013 1755   BILIRUBINUR Negative 12/30/2022 1519   BILIRUBINUR Negative 11/15/2013 1450   KETONESUR Negative 11/15/2013 1450   KETONESUR NEGATIVE 08/29/2013 1755   PROTEINUR Trace (A) 12/30/2022 1519   PROTEINUR Negative 11/15/2013 1450   PROTEINUR NEGATIVE 08/29/2013 1755   UROBILINOGEN 1.0 08/29/2013 1755   NITRITE Negative 12/30/2022  1519   NITRITE Negative 11/15/2013 1450   NITRITE NEGATIVE 08/29/2013 1755   LEUKOCYTESUR Trace (A) 12/30/2022 1519   LEUKOCYTESUR Negative 11/15/2013 1450   Sepsis Labs Recent Labs  Lab 02/09/24 0906  WBC 6.5   Microbiology No results found for this or any previous visit (from the past 240 hours).   Time coordinating discharge: 35 minutes  SIGNED:   Anthony CHRISTELLA Pouch, MD  [facility] 02/10/2024, 1:48 PM Pager   If 7PM-7AM, please contact night-coverage [email or web address]      [1]  Allergies Allergen Reactions   Amoxicillin-Pot Clavulanate Nausea And Vomiting   Metformin And Related Nausea Only   Metformin Hcl     Other Reaction(s): abd cramping

## 2024-02-10 NOTE — Plan of Care (Signed)
 Neurology plan of care  Per PT, patient back to baseline and NIHSS = 0.  TIA workup this admission:  MRI brain wo contrast 1. No acute intracranial abnormality. 2. Mild chronic microvascular ischemic disease for age.  CTA H&N 1. Negative CTA for large vessel occlusion or other emergent finding. 2. Mild atheromatous change about the carotid bifurcations and carotid siphons without hemodynamically significant stenosis. 3. 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 4. Enlarged heterogeneous multinodular thyroid. Further evaluation with dedicated nonemergent thyroid ultrasound recommended. 5. 4 mm right upper lobe pulmonary nodule. Per Fleischner Society Guidelines, if patient is low risk for malignancy, no routine follow-up imaging is recommended. If patient is high risk for malignancy, a non-contrast Chest CT at 12 months is optional. If performed and the nodule is stable at 12 months, no further follow-up is recommended.  TTE - pending  Stroke Labs     Component Value Date/Time   CHOL 245 (H) 02/10/2024 0549   TRIG 226 (H) 02/10/2024 0549   HDL 32 (L) 02/10/2024 0549   CHOLHDL 7.8 02/10/2024 0549   VLDL 45 (H) 02/10/2024 0549   LDLCALC 168 (H) 02/10/2024 0549    Lab Results  Component Value Date/Time   HGBA1C 6.4 (H) 02/09/2024 09:06 AM   A/P: This is a 68 yo woman who presented with multifocal neurologic deficits L MCA and possibly R MCA as well, now back to baseline with normal MRI brain. Etiology of presentation favored to be TIA. Initially she told me that she previously had ambulatory cardiac monitoring that showed 2 brief episodes of what she thought might have been a fib but on review of the available report in epic she had 2 brief runs of asymptomatic SVT. Other stroke risk factors include DM2 (well-controlled) and HL (uncontrolled).  - Goal normotension, avoid hypotension - F/u TTE - ASA 81mg  daily + plavix  75mg  daily x21 days f/b ASA  81mg  daily monotherapy after that - Atorvastatin  80mg  daily - After TTE results, if no significant abnormalities, OK to discharge from neuro standpoint after Zio is placed - I will refer to outpatient neurology for f/u. She should also f/u with her established outpatient cardiologist Dr. Mady.  Neurology will be available prn for questions going forward.  Ellen Ross, MD [facility] [phone]  If 7pm- 7am, please page neurology on call as listed in AMION.

## 2024-02-10 NOTE — Progress Notes (Signed)
 SLP Cancellation Note  Patient Details Name: Ellen Taylor MRN: [id number] DOB: September 15, 1955   Cancelled treatment:       Reason Eval/Treat Not Completed: SLP screened, no needs identified, will sign off (Per OT & PT notes, pt back to baseline. NIHSS=0. No changes to speech, language, cognition, or swallowing noted at this time.)  [name], M.S., CCC-SLP Speech-Language Pathologist [facility] [phone] [place])  [name] 02/10/2024, 8:32 AM

## 2024-02-10 NOTE — Progress Notes (Signed)
 PT Cancellation Note  Patient Details Name: Ellen Taylor MRN: [id number] DOB: 07-31-1955   Cancelled Treatment:    Reason Eval/Treat Not Completed: PT screened, no needs identified, will sign off Per OT, patient reports she is back at baseline. Ambulatory to/from bathroom independently. No skilled PT needs required at this time. PT will complete orders. If needs arise, please re-consult.   Maryanne Finder, PT, DPT Physical Therapist - [place]  [facility]    Edel Rivero A Jencarlo Bonadonna 02/10/2024, 8:21 AM

## 2024-02-22 NOTE — Progress Notes (Unsigned)
 " Cardiology Office Note   Date:  02/23/2024  ID:  Ellen Taylor, DOB May 07, 1955, MRN [id number] PCP: [facility]  [facility] Providers Cardiologist:  Ellen Hanson, MD {  History of Present Illness Ellen Taylor is a 68 y.o. female with a history of hypertension, hyperlipidemia, diabetes type 2, TIA, and rheumatoid arthritis who presents for hospital follow-up.  Patient was initially seen by Dr. Hanson 09/29/2023 for palpitations.  Heart monitor showed single brief episode of SVT.  Echo showed EF of 60 to 65%, grade 1 diastolic dysfunction, mild MR.  Carotid Dopplers showed bilateral mild stenosis.  Repeat monitor showed predominantly normal sinus rhythm, rare PACs and PVCs, 2 runs of SVT lasting up to 30 seconds with a max rate of 167 bpm.  No sustained arrhythmias or pauses.  Patient triggered events associated with normal sinus rhythm.  Patient was last seen 11/11/2023 reporting occasional symptoms.  Reassurance was provided.  The patient was admitted mid December 2025 for TIA. She had right sided heaviness tingling and speech was slurred. CT showed no acute findings. Symptoms resolved within 30 minutes. MRI brain was negative. Echo showed normal pump function, negative bubble study. She was started on ASA and PALvix x 21 days, followed by ASA. She was given heart monitor.   Today, the patient has been normal since being back home. She had one episode of dizziness that was very brief. She denies chest pain, SOB, lower leg edema. She was not taking ASA since she didn't fully understand instructions. She is taking plavix .   Studies Reviewed      Echo bubble study 01/2024 1. Left ventricular ejection fraction, by estimation, is 60 to 65%. The  left ventricle has normal function. The left ventricle has no regional  wall motion abnormalities. Left ventricular diastolic parameters are  consistent with Grade I diastolic  dysfunction (impaired relaxation).   2.  Right ventricular systolic function is normal. The right ventricular  size is normal. Tricuspid regurgitation signal is inadequate for assessing  PA pressure.   3. The mitral valve is normal in structure. No evidence of mitral valve  regurgitation. No evidence of mitral stenosis.   4. The aortic valve is normal in structure. Aortic valve regurgitation is  not visualized. No aortic stenosis is present.   5. The inferior vena cava is normal in size with greater than 50%  respiratory variability, suggesting right atrial pressure of 3 mmHg.   6. Agitated saline contrast bubble study was negative, with no evidence  of any interatrial shunt.   Heart monitor 10/2023    The patient was monitored for 14 days.   The predominant rhythm was sinus with an average rate of 69 bpm (range 49-133 bpm in sinus).   There were rare PACs and PVCs.   2 supraventricular runs lasting up to 29.3 seconds occurred with a maximum rate of 167 bpm.   There was no sustained arrhythmia or prolonged pause.   Patient triggered events correspond to normal sinus rhythm.   Predominantly sinus rhythm with rare PACs and PVCs as well as 2 episodes of PSVT as detailed above.  Carotid US  09/2023 Summary:  Right Carotid: Velocities in the right ICA are consistent with a 1-39%  stenosis.                Mild smooth, homogenous plaque noted in the carotid bulb.   Left Carotid: Velocities in the left ICA are consistent with a 1-39%  stenosis.  Mild smooth, homogenous plaque noted in the carotid bulb.   Vertebrals:  Bilateral vertebral arteries demonstrate antegrade flow.  Subclavians: Normal flow hemodynamics were seen in bilateral subclavian               arteries.   *See table(s) above for measurements and observations.  Suggest follow up study in 12 months.   Echo 09/2023 1. Left ventricular ejection fraction, by estimation, is 60 to 65%. The  left ventricle has normal function. The left ventricle has no  regional  wall motion abnormalities. Left ventricular diastolic parameters are  consistent with Grade I diastolic  dysfunction (impaired relaxation).   2. Right ventricular systolic function is normal. The right ventricular  size is normal. Tricuspid regurgitation signal is inadequate for assessing  PA pressure.   3. Left atrial size was mildly dilated.   4. The mitral valve is normal in structure. Mild mitral valve  regurgitation. No evidence of mitral stenosis.   5. The aortic valve is normal in structure. Aortic valve regurgitation is  not visualized. No aortic stenosis is present.   6. The inferior vena cava is normal in size with greater than 50%  respiratory variability, suggesting right atrial pressure of 3 mmHg.       Physical Exam VS:  BP (!) 140/60 (BP Location: Left Arm, Patient Position: Sitting, Cuff Size: Normal) Comment: has takemn meds this AM  Pulse 68   Ht 5' 1 (1.549 m)   Wt 173 lb 3.2 oz (78.6 kg)   SpO2 98%   BMI 32.73 kg/m        Wt Readings from Last 3 Encounters:  02/23/24 173 lb 3.2 oz (78.6 kg)  02/09/24 172 lb (78 kg)  11/11/23 172 lb 6 oz (78.2 kg)    GEN: Well nourished, well developed in no acute distress NECK: No JVD; No carotid bruits CARDIAC: RRR, no murmurs, rubs, gallops RESPIRATORY:  Clear to auscultation without rales, wheezing or rhonchi  ABDOMEN: Soft, non-tender, non-distended EXTREMITIES:  No edema; No deformity   ASSESSMENT AND PLAN  Dizziness Abnormal EKG Patient previously evaluated for intermittent episodes of lightheadedness and dizziness.  Echo showed normal pump function with grade 1 diastolic dysfunction and mild MR.  Heart monitor showed predominantly normal sinus rhythm, 2 short episodes of SVT.  Triggered events corresponded with normal sinus rhythm.  Carotid Doppler showed mild stenosis.  Orthostatics were previously negative.  Patient reports 1 brief episode of dizziness, but overall improved symptoms.  HTN Blood  pressure is mildly elevated.  We can address at follow-up if still elevated.  HLD LDL 169, HDL 32, TG 226, TC 245.  Continue atorvastatin  and Zetia .  Can repeat lipid panel at follow-up as atorvastatin  was recently increased.  Possible TIA She was recently admitted for right sided numbness, tingling, and speech problems.  CT head and MRI of the head showed no acute findings.  Symptoms resolved within 30 minutes.  Echo showed normal pump function and negative bubble study.  She was discharged on aspirin  and Plavix  for 21 days, followed by monotherapy with aspirin .  Patient was not taking aspirin , so we will restart this.  Patient has referral to neurology.  She is currently wearing a heart monitor. Continue Plavix  and statin therapy.    Dispo: Follow-up in 3 months  Signed, Naresh Althaus VEAR Fishman, PA-C   "

## 2024-02-23 ENCOUNTER — Ambulatory Visit: Attending: Medical | Admitting: Medical

## 2024-02-23 ENCOUNTER — Encounter: Payer: Self-pay | Admitting: Medical

## 2024-02-23 VITALS — BP 140/60 | HR 68 | Ht 61.0 in | Wt 173.2 lb

## 2024-02-23 DIAGNOSIS — G459 Transient cerebral ischemic attack, unspecified: Secondary | ICD-10-CM | POA: Diagnosis not present

## 2024-02-23 DIAGNOSIS — E782 Mixed hyperlipidemia: Secondary | ICD-10-CM | POA: Diagnosis not present

## 2024-02-23 DIAGNOSIS — R42 Dizziness and giddiness: Secondary | ICD-10-CM

## 2024-02-23 DIAGNOSIS — I10 Essential (primary) hypertension: Secondary | ICD-10-CM | POA: Diagnosis not present

## 2024-02-23 MED ORDER — ASPIRIN 81 MG PO TBEC: 81.0000 mg | DELAYED_RELEASE_TABLET | Freq: Every day | ORAL | 3 refills | Status: AC

## 2024-02-23 NOTE — Patient Instructions (Signed)
 Medication Instructions:   Your physician recommends the following medication changes.  START TAKING: Aspirin  81 mg once daily  *If you need a refill on your cardiac medications before your next appointment, please call your pharmacy*  Lab Work:  None ordered at this time   If you have labs (blood work) drawn today and your tests are completely normal, you will receive your results only by:  MyChart Message (if you have MyChart) OR  A paper copy in the mail If you have any lab test that is abnormal or we need to change your treatment, we will call you to review the results.  Testing/Procedures:  None ordered at this time   Referrals:  None ordered at this time   Follow-Up:  At [facility], you and your health needs are our priority.  As part of our continuing mission to provide you with exceptional heart care, our providers are all part of one team.  This team includes your primary Cardiologist (physician) and Advanced Practice Providers or APPs (Physician Assistants and Nurse Practitioners) who all work together to provide you with the care you need, when you need it.  Your next appointment:   3 month(s)  Provider:    You may see Lonni Hanson, MD or one of the following Advanced Practice Providers on your designated Care Team:   Lonni Meager, NP Lesley Maffucci, PA-C Bernardino Bring, PA-C Cadence [place], PA-C Tylene Lunch, NP Barnie Hila, NP    We recommend signing up for the patient portal called MyChart.  Sign up information is provided on this After Visit Summary.  MyChart is used to connect with patients for Virtual Visits (Telemedicine).  Patients are able to view lab/test results, encounter notes, upcoming appointments, etc.  Non-urgent messages can be sent to your provider as well.   To learn more about what you can do with MyChart, go to [email or web address].

## 2024-03-14 NOTE — Addendum Note (Signed)
 Encounter addended by: Cristopher Olivia PARAS, RN on: 03/14/2024 9:08 AM  Actions taken: Imaging Exam ended

## 2024-03-16 DIAGNOSIS — G459 Transient cerebral ischemic attack, unspecified: Secondary | ICD-10-CM | POA: Diagnosis not present

## 2024-03-19 ENCOUNTER — Ambulatory Visit: Payer: Self-pay | Admitting: Medical

## 2024-05-24 ENCOUNTER — Ambulatory Visit: Admitting: Medical
